# Patient Record
Sex: Male | Born: 1965 | Race: Black or African American | Hispanic: No | Smoking: Never smoker
Health system: Southern US, Community
[De-identification: ages and names within clinical notes are randomized; demographics above are authoritative.]

## PROBLEM LIST (undated history)

## (undated) ENCOUNTER — Emergency Department (HOSPITAL_COMMUNITY): Discharge status: Absent without leave | Payer: 59

## (undated) DIAGNOSIS — K219 Gastro-esophageal reflux disease without esophagitis: Secondary | ICD-10-CM

## (undated) DIAGNOSIS — M549 Dorsalgia, unspecified: Secondary | ICD-10-CM

## (undated) DIAGNOSIS — N400 Enlarged prostate without lower urinary tract symptoms: Secondary | ICD-10-CM

## (undated) DIAGNOSIS — B029 Zoster without complications: Secondary | ICD-10-CM

---

## 2001-01-13 ENCOUNTER — Emergency Department (HOSPITAL_COMMUNITY): Admission: EM | Admit: 2001-01-13 | Discharge: 2001-01-14 | Payer: Self-pay | Admitting: *Deleted

## 2001-01-13 ENCOUNTER — Encounter: Payer: Self-pay | Admitting: *Deleted

## 2001-01-14 ENCOUNTER — Encounter: Payer: Self-pay | Admitting: Emergency Medicine

## 2001-01-14 ENCOUNTER — Inpatient Hospital Stay (HOSPITAL_COMMUNITY): Admission: EM | Admit: 2001-01-14 | Discharge: 2001-01-15 | Payer: Self-pay | Admitting: Emergency Medicine

## 2001-01-14 ENCOUNTER — Encounter: Payer: Self-pay | Admitting: *Deleted

## 2001-01-15 ENCOUNTER — Encounter: Payer: Self-pay | Admitting: Internal Medicine

## 2001-11-03 ENCOUNTER — Emergency Department (HOSPITAL_COMMUNITY): Admission: EM | Admit: 2001-11-03 | Discharge: 2001-11-03 | Payer: Self-pay

## 2002-07-26 ENCOUNTER — Encounter: Payer: Self-pay | Admitting: Emergency Medicine

## 2002-07-26 ENCOUNTER — Emergency Department (HOSPITAL_COMMUNITY): Admission: EM | Admit: 2002-07-26 | Discharge: 2002-07-26 | Payer: Self-pay | Admitting: Emergency Medicine

## 2003-05-31 ENCOUNTER — Emergency Department (HOSPITAL_COMMUNITY): Admission: EM | Admit: 2003-05-31 | Discharge: 2003-05-31 | Payer: Self-pay | Admitting: Emergency Medicine

## 2003-07-05 ENCOUNTER — Emergency Department (HOSPITAL_COMMUNITY): Admission: EM | Admit: 2003-07-05 | Discharge: 2003-07-05 | Payer: Self-pay | Admitting: Emergency Medicine

## 2003-07-08 ENCOUNTER — Emergency Department (HOSPITAL_COMMUNITY): Admission: EM | Admit: 2003-07-08 | Discharge: 2003-07-08 | Payer: Self-pay | Admitting: *Deleted

## 2003-07-11 ENCOUNTER — Ambulatory Visit (HOSPITAL_COMMUNITY): Admission: RE | Admit: 2003-07-11 | Discharge: 2003-07-11 | Payer: Self-pay | Admitting: *Deleted

## 2004-10-27 ENCOUNTER — Emergency Department (HOSPITAL_COMMUNITY): Admission: EM | Admit: 2004-10-27 | Discharge: 2004-10-27 | Payer: Self-pay | Admitting: Emergency Medicine

## 2004-11-12 ENCOUNTER — Emergency Department (HOSPITAL_COMMUNITY): Admission: EM | Admit: 2004-11-12 | Discharge: 2004-11-12 | Payer: Self-pay | Admitting: Emergency Medicine

## 2004-12-22 ENCOUNTER — Emergency Department (HOSPITAL_COMMUNITY): Admission: EM | Admit: 2004-12-22 | Discharge: 2004-12-22 | Payer: Self-pay | Admitting: Emergency Medicine

## 2005-05-19 ENCOUNTER — Emergency Department (HOSPITAL_COMMUNITY): Admission: EM | Admit: 2005-05-19 | Discharge: 2005-05-19 | Payer: Self-pay | Admitting: Emergency Medicine

## 2006-01-19 ENCOUNTER — Ambulatory Visit (HOSPITAL_COMMUNITY): Admission: RE | Admit: 2006-01-19 | Discharge: 2006-01-19 | Payer: Self-pay | Admitting: Family Medicine

## 2007-01-10 ENCOUNTER — Emergency Department (HOSPITAL_COMMUNITY): Admission: EM | Admit: 2007-01-10 | Discharge: 2007-01-10 | Payer: Self-pay | Admitting: Emergency Medicine

## 2008-01-05 ENCOUNTER — Emergency Department (HOSPITAL_COMMUNITY): Admission: EM | Admit: 2008-01-05 | Discharge: 2008-01-05 | Payer: Self-pay | Admitting: Emergency Medicine

## 2008-08-18 ENCOUNTER — Emergency Department (HOSPITAL_COMMUNITY): Admission: EM | Admit: 2008-08-18 | Discharge: 2008-08-18 | Payer: Self-pay | Admitting: Emergency Medicine

## 2009-01-14 ENCOUNTER — Emergency Department (HOSPITAL_COMMUNITY): Admission: EM | Admit: 2009-01-14 | Discharge: 2009-01-14 | Payer: Self-pay | Admitting: Emergency Medicine

## 2009-01-17 ENCOUNTER — Emergency Department (HOSPITAL_COMMUNITY): Admission: EM | Admit: 2009-01-17 | Discharge: 2009-01-17 | Payer: Self-pay | Admitting: Emergency Medicine

## 2009-05-24 DIAGNOSIS — B029 Zoster without complications: Secondary | ICD-10-CM

## 2009-05-24 HISTORY — DX: Zoster without complications: B02.9

## 2010-01-29 ENCOUNTER — Emergency Department (HOSPITAL_COMMUNITY): Admission: EM | Admit: 2010-01-29 | Discharge: 2010-01-29 | Payer: Self-pay | Admitting: Emergency Medicine

## 2010-04-16 ENCOUNTER — Emergency Department (HOSPITAL_COMMUNITY)
Admission: EM | Admit: 2010-04-16 | Discharge: 2010-04-16 | Payer: Self-pay | Source: Home / Self Care | Admitting: Emergency Medicine

## 2010-06-14 ENCOUNTER — Encounter: Payer: Self-pay | Admitting: Neurosurgery

## 2010-06-15 ENCOUNTER — Encounter: Payer: Self-pay | Admitting: Family Medicine

## 2010-07-07 ENCOUNTER — Emergency Department (HOSPITAL_COMMUNITY): Payer: Self-pay

## 2010-07-07 ENCOUNTER — Emergency Department (HOSPITAL_COMMUNITY)
Admission: EM | Admit: 2010-07-07 | Discharge: 2010-07-07 | Disposition: A | Discharge status: Home / Self Care | Payer: Self-pay | Attending: Emergency Medicine | Admitting: Emergency Medicine

## 2010-07-07 ENCOUNTER — Encounter (HOSPITAL_COMMUNITY): Payer: Self-pay | Admitting: Radiology

## 2010-07-07 DIAGNOSIS — J019 Acute sinusitis, unspecified: Secondary | ICD-10-CM | POA: Insufficient documentation

## 2010-07-07 DIAGNOSIS — H53149 Visual discomfort, unspecified: Secondary | ICD-10-CM | POA: Insufficient documentation

## 2010-07-07 DIAGNOSIS — R51 Headache: Secondary | ICD-10-CM | POA: Insufficient documentation

## 2010-07-07 LAB — CBC
HCT: 46.6 % (ref 39.0–52.0)
Hemoglobin: 16 g/dL (ref 13.0–17.0)
MCH: 31.9 pg (ref 26.0–34.0)
MCV: 92.8 fL (ref 78.0–100.0)

## 2010-07-07 LAB — DIFFERENTIAL
Basophils Absolute: 0 10*3/uL (ref 0.0–0.1)
Basophils Relative: 0 % (ref 0–1)
Lymphocytes Relative: 12 % (ref 12–46)
Monocytes Relative: 8 % (ref 3–12)

## 2010-07-07 LAB — BASIC METABOLIC PANEL
CO2: 22 mEq/L (ref 19–32)
Calcium: 9.5 mg/dL (ref 8.4–10.5)
Chloride: 106 mEq/L (ref 96–112)
Creatinine, Ser: 1.07 mg/dL (ref 0.4–1.5)
GFR calc Af Amer: >60 mL/min (ref >60)
GFR calc non Af Amer: >60 mL/min (ref >60)
Glucose, Bld: 92 mg/dL (ref 70–99)
Sodium: 138 mEq/L (ref 135–145)

## 2010-08-04 ENCOUNTER — Emergency Department (HOSPITAL_COMMUNITY)
Admission: EM | Admit: 2010-08-04 | Discharge: 2010-08-04 | Disposition: A | Discharge status: Home / Self Care | Payer: Self-pay | Attending: Emergency Medicine | Admitting: Emergency Medicine

## 2010-08-04 DIAGNOSIS — B029 Zoster without complications: Secondary | ICD-10-CM | POA: Insufficient documentation

## 2010-08-04 DIAGNOSIS — M549 Dorsalgia, unspecified: Secondary | ICD-10-CM | POA: Insufficient documentation

## 2010-09-03 LAB — URINALYSIS, ROUTINE W REFLEX MICROSCOPIC
Hgb urine dipstick: NEGATIVE
Urobilinogen, UA: 1 mg/dL (ref 0.0–1.0)

## 2010-09-03 LAB — BASIC METABOLIC PANEL
BUN: 9 mg/dL (ref 6–23)
CO2: 26 mEq/L (ref 19–32)
Chloride: 106 mEq/L (ref 96–112)
Creatinine, Ser: 1.08 mg/dL (ref 0.4–1.5)
GFR calc non Af Amer: >60 mL/min (ref >60)
Potassium: 3.8 mEq/L (ref 3.5–5.1)
Sodium: 138 mEq/L (ref 135–145)

## 2010-09-03 LAB — CBC
Platelets: 275 10*3/uL (ref 150–400)
RBC: 4.97 MIL/uL (ref 4.22–5.81)
RDW: 13.2 % (ref 11.5–15.5)

## 2010-09-03 LAB — DIFFERENTIAL: Monocytes Absolute: 0.8 10*3/uL (ref 0.1–1.0)

## 2011-06-14 ENCOUNTER — Encounter (HOSPITAL_COMMUNITY): Payer: Self-pay

## 2011-06-14 ENCOUNTER — Emergency Department (HOSPITAL_COMMUNITY): Payer: Self-pay

## 2011-06-14 ENCOUNTER — Emergency Department (HOSPITAL_COMMUNITY)
Admission: EM | Admit: 2011-06-14 | Discharge: 2011-06-14 | Disposition: A | Discharge status: Home / Self Care | Payer: Self-pay | Attending: Emergency Medicine | Admitting: Emergency Medicine

## 2011-06-14 DIAGNOSIS — R109 Unspecified abdominal pain: Secondary | ICD-10-CM | POA: Insufficient documentation

## 2011-06-14 DIAGNOSIS — M545 Low back pain, unspecified: Secondary | ICD-10-CM | POA: Insufficient documentation

## 2011-06-14 DIAGNOSIS — R059 Cough, unspecified: Secondary | ICD-10-CM | POA: Insufficient documentation

## 2011-06-14 DIAGNOSIS — B349 Viral infection, unspecified: Secondary | ICD-10-CM

## 2011-06-14 DIAGNOSIS — B9789 Other viral agents as the cause of diseases classified elsewhere: Secondary | ICD-10-CM | POA: Insufficient documentation

## 2011-06-14 DIAGNOSIS — R05 Cough: Secondary | ICD-10-CM | POA: Insufficient documentation

## 2011-06-14 HISTORY — DX: Dorsalgia, unspecified: M54.9

## 2011-06-14 LAB — URINALYSIS, ROUTINE W REFLEX MICROSCOPIC
Bilirubin Urine: NEGATIVE
Protein, ur: NEGATIVE mg/dL
Specific Gravity, Urine: 1.025 (ref 1.005–1.030)
Urobilinogen, UA: 0.2 mg/dL (ref 0.0–1.0)

## 2011-06-14 LAB — COMPREHENSIVE METABOLIC PANEL
ALT: 31 U/L (ref 0–53)
Albumin: 3.8 g/dL (ref 3.5–5.2)
Alkaline Phosphatase: 73 U/L (ref 39–117)
BUN: 12 mg/dL (ref 6–23)
CO2: 25 mEq/L (ref 19–32)
Creatinine, Ser: 1.13 mg/dL (ref 0.50–1.35)
GFR calc Af Amer: 90 mL/min — ABNORMAL LOW (ref >90)
GFR calc non Af Amer: 77 mL/min — ABNORMAL LOW (ref >90)
Potassium: 4.1 mEq/L (ref 3.5–5.1)
Total Protein: 7.2 g/dL (ref 6.0–8.3)

## 2011-06-14 MED ORDER — TRAMADOL HCL 50 MG PO TABS: 50.0000 mg | ORAL_TABLET | Freq: Three times a day (TID) | ORAL | Status: AC | PRN

## 2011-06-14 MED ORDER — SODIUM CHLORIDE 0.9 % IV BOLUS (SEPSIS)
1000.0000 mL | Freq: Once | INTRAVENOUS | Status: AC
  Administered 2011-06-14: 1000 mL via INTRAVENOUS

## 2011-06-14 MED ORDER — KETOROLAC TROMETHAMINE 30 MG/ML IJ SOLN
30.0000 mg | Freq: Once | INTRAMUSCULAR | Status: AC
  Administered 2011-06-14: 30 mg via INTRAVENOUS
  Filled 2011-06-14: qty 1

## 2011-06-14 NOTE — ED Notes (Signed)
Pt denies abd pain at this time, however, is still c/o lower back pain rating it at a 5.

## 2011-06-14 NOTE — ED Provider Notes (Signed)
This chart was scribed for Gerhard Munch, MD by Williemae Natter. The patient was seen in room APA19/APA19 at 7:44 AM.  CSN: [id number]  Arrival date & time 06/14/11  1478   First MD Initiated Contact with Patient 06/14/11 [phone]      Chief Complaint  Patient presents with  . URI  . Back Pain  . Abdominal Pain    (Consider location/radiation/quality/duration/timing/severity/associated sxs/prior treatment) HPI Cory Terry is a 46 y.o. male who presents to the Emergency Department complaining of abdominal pain and lower back pain since this morning when he woke up for work. Pt has had cold symptoms for 3-4 days which he has been treating with alka-seltzer and cough drops with mild improvement. Pt denies any fever, nausea, vomiting, or diarrhea. He has not had a flu shot and does have sick contacts both at home and at work.   Past Medical History  Diagnosis Date  . Back pain     History reviewed. No pertinent past surgical history.  No family history on file.  History  Substance Use Topics  . Smoking status: Never Smoker   . Smokeless tobacco: Not on file  . Alcohol Use: Yes     occasionally      Review of Systems  Constitutional: Negative for fever and chills.  Eyes: Negative for photophobia.  Respiratory: Positive for cough. Negative for apnea.   Gastrointestinal: Positive for abdominal pain. Negative for nausea, vomiting and diarrhea.  Musculoskeletal: Positive for back pain.  Neurological: Negative for light-headedness and headaches.  Psychiatric/Behavioral: Negative for confusion.    Allergies  Review of patient's allergies indicates no known allergies.  Home Medications  No current outpatient prescriptions on file. Pulse oximetry on room air is 100%. Normal by my interpretation.  BP 138/86  Pulse 62  Temp(Src) 98 F (36.7 C) (Oral)  Resp 18  Ht 6\' 2"  (1.88 m)  Wt 194 lb (87.998 kg)  BMI 24.91 kg/m2  SpO2 100%  Physical Exam  Nursing note and  vitals reviewed. Constitutional: He is oriented to person, place, and time. He appears well-developed and well-nourished.  HENT:  Head: Normocephalic and atraumatic.  Neck: Normal range of motion. Neck supple.  Cardiovascular: Normal rate, regular rhythm and normal heart sounds.   Pulmonary/Chest: Effort normal and breath sounds normal.  Abdominal: Soft.  Musculoskeletal:       Negative straight leg  Neurological: He is alert and oriented to person, place, and time.  Skin: Skin is warm and dry.  Psychiatric: He has a normal mood and affect. His behavior is normal.    ED Course  Procedures (including critical care time)   Labs Reviewed  URINALYSIS, ROUTINE W REFLEX MICROSCOPIC   No results found.   No diagnosis found.  CXR (reviewed by me) - no acute findings  MDM  I personally performed the services described in this documentation, which was scribed in my presence. The recorded information has been reviewed and considered.  Generally well 46 year old male with one week of URI like symptoms.  Now presents with new abdominal/back pain.  On exam the patient is in no distress with unremarkable vital signs, and no appreciable significant, abdominal tenderness, nor any focal neurologic deficits.  The patient's x-ray does not suggest pneumonia, nor acute pathology.  The patient's lab findings are unremarkable, given the patient's description of one week of illness, his exposure to multiple sick contacts, this illness is most consistent with viral etiology.  The absence of acute findings, or laboratory results  is reassuring.  The patient will be discharged with instructions for further symptom control and PMD followup   Gerhard Munch, MD 06/14/11 [phone]

## 2011-06-14 NOTE — ED Notes (Signed)
Pt reports cold symptoms x 1 week.  Reports woke up early this am with abd pain and lower back pain.  Denies any n/v/d.  Denies any urinary problems.

## 2012-05-17 ENCOUNTER — Emergency Department (HOSPITAL_COMMUNITY)
Admission: EM | Admit: 2012-05-17 | Discharge: 2012-05-17 | Disposition: A | Discharge status: Home / Self Care | Payer: Self-pay | Attending: Emergency Medicine | Admitting: Emergency Medicine

## 2012-05-17 ENCOUNTER — Encounter (HOSPITAL_COMMUNITY): Payer: Self-pay | Admitting: *Deleted

## 2012-05-17 DIAGNOSIS — R220 Localized swelling, mass and lump, head: Secondary | ICD-10-CM | POA: Insufficient documentation

## 2012-05-17 DIAGNOSIS — R1110 Vomiting, unspecified: Secondary | ICD-10-CM | POA: Insufficient documentation

## 2012-05-17 DIAGNOSIS — K0889 Other specified disorders of teeth and supporting structures: Secondary | ICD-10-CM

## 2012-05-17 DIAGNOSIS — K089 Disorder of teeth and supporting structures, unspecified: Secondary | ICD-10-CM | POA: Insufficient documentation

## 2012-05-17 DIAGNOSIS — H9209 Otalgia, unspecified ear: Secondary | ICD-10-CM | POA: Insufficient documentation

## 2012-05-17 DIAGNOSIS — Z8739 Personal history of other diseases of the musculoskeletal system and connective tissue: Secondary | ICD-10-CM | POA: Insufficient documentation

## 2012-05-17 LAB — BASIC METABOLIC PANEL
BUN: 17 mg/dL (ref 6–23)
GFR calc Af Amer: 80 mL/min — ABNORMAL LOW (ref >90)
GFR calc non Af Amer: 69 mL/min — ABNORMAL LOW (ref >90)
Glucose, Bld: 91 mg/dL (ref 70–99)
Potassium: 3.2 mEq/L — ABNORMAL LOW (ref 3.5–5.1)

## 2012-05-17 LAB — CBC WITH DIFFERENTIAL/PLATELET
Basophils Absolute: 0 10*3/uL (ref 0.0–0.1)
HCT: 45.6 % (ref 39.0–52.0)
Hemoglobin: 15.6 g/dL (ref 13.0–17.0)
Lymphocytes Relative: 21 % (ref 12–46)
MCH: 31.6 pg (ref 26.0–34.0)
MCHC: 34.2 g/dL (ref 30.0–36.0)
Monocytes Absolute: 1 10*3/uL (ref 0.1–1.0)
Monocytes Relative: 9 % (ref 3–12)
Neutrophils Relative %: 69 % (ref 43–77)
RBC: 4.94 MIL/uL (ref 4.22–5.81)

## 2012-05-17 MED ORDER — FAMOTIDINE IN NACL 20-0.9 MG/50ML-% IV SOLN
20.0000 mg | Freq: Once | INTRAVENOUS | Status: AC
  Administered 2012-05-17: 20 mg via INTRAVENOUS
  Filled 2012-05-17: qty 50

## 2012-05-17 MED ORDER — ONDANSETRON HCL 4 MG/2ML IJ SOLN
4.0000 mg | Freq: Once | INTRAMUSCULAR | Status: AC
  Administered 2012-05-17: 4 mg via INTRAVENOUS
  Filled 2012-05-17: qty 2

## 2012-05-17 MED ORDER — HYDROMORPHONE HCL PF 1 MG/ML IJ SOLN
1.0000 mg | Freq: Once | INTRAMUSCULAR | Status: AC
  Administered 2012-05-17: 1 mg via INTRAVENOUS
  Filled 2012-05-17: qty 1

## 2012-05-17 MED ORDER — SODIUM CHLORIDE 0.9 % IV SOLN
Freq: Once | INTRAVENOUS | Status: AC
  Administered 2012-05-17: 19:00:00 via INTRAVENOUS

## 2012-05-17 MED ORDER — OXYCODONE-ACETAMINOPHEN 5-325 MG PO TABS: 1.0000 | ORAL_TABLET | ORAL | Status: AC | PRN

## 2012-05-17 MED ORDER — AMOXICILLIN 500 MG PO CAPS: 500.0000 mg | ORAL_CAPSULE | Freq: Three times a day (TID) | ORAL | Status: DC

## 2012-05-17 MED ORDER — CLINDAMYCIN HCL 150 MG PO CAPS
300.0000 mg | ORAL_CAPSULE | Freq: Once | ORAL | Status: AC
  Administered 2012-05-17: 300 mg via ORAL
  Filled 2012-05-17: qty 2

## 2012-05-17 MED ORDER — OXYCODONE-ACETAMINOPHEN 5-325 MG PO TABS
1.0000 | ORAL_TABLET | Freq: Once | ORAL | Status: AC
  Administered 2012-05-17: 1 via ORAL
  Filled 2012-05-17: qty 1

## 2012-05-17 NOTE — ED Notes (Signed)
Pt c/o right side dental pain that radiates to jaw. Pt also c/o vomiting which he relates to the pain.

## 2012-05-17 NOTE — ED Notes (Signed)
Pt states he thinks a tooth on right upper jaw may have broke off, c/o pain x 2 days, states that he taking a lot of ibuprofen and it's making him sick with vomiting

## 2012-05-17 NOTE — ED Provider Notes (Signed)
History     CSN: [id number]  Arrival date & time 05/17/12  1755   First MD Initiated Contact with Patient 05/17/12 1811      Chief Complaint  Patient presents with  . Dental Pain  . Emesis    (Consider location/radiation/quality/duration/timing/severity/associated sxs/prior treatment) HPI Comments: Patient c/o right upper tooth pain for 2 days.  States that a tooth recently "broke off" and since then he has been experiencing sharp pains in his tooth and upper jaw that radiate to his right ear and face.  States the pain is worse when he tries to eat anything hot or cold.  He also c/o vomiting today.  States that he has been taking ibuprofen every 4-6 hrs today and he thinks that it may have upset his stomach.  He denies hx of stomach ulcers, fever, abdominal pain, difficulty swallowing or breathing.    Patient is a 46 y.o. male presenting with tooth pain. The history is provided by the patient.  Dental PainThe primary symptoms include mouth pain. Primary symptoms do not include dental injury, oral bleeding, oral lesions, headaches, fever, shortness of breath, sore throat, angioedema or cough. The symptoms began 2 days ago. The symptoms are worsening. The symptoms are new. The symptoms occur constantly.  Mouth pain began 24 -48 hours ago. Mouth pain occurs constantly. Mouth pain is unchanged. Affected locations include: teeth and gum(s).  Additional symptoms include: dental sensitivity to temperature, gum tenderness, facial swelling and ear pain. Additional symptoms do not include: gum swelling, purulent gums, trismus, jaw pain, trouble swallowing, pain with swallowing, drooling, nosebleeds and swollen glands. Associated symptoms comments: Intermittent vomiting for 24 hrs. Medical issues include: periodontal disease. Medical issues do not include: smoking.    Past Medical History  Diagnosis Date  . Back pain     History reviewed. No pertinent past surgical history.  History reviewed. No  pertinent family history.  History  Substance Use Topics  . Smoking status: Never Smoker   . Smokeless tobacco: Not on file  . Alcohol Use: Yes     Comment: occasionally      Review of Systems  Constitutional: Negative for fever, chills, activity change and appetite change.  HENT: Positive for ear pain, facial swelling and dental problem. Negative for nosebleeds, sore throat, drooling, trouble swallowing, neck pain and neck stiffness.   Eyes: Negative for visual disturbance.  Respiratory: Negative for cough and shortness of breath.   Cardiovascular: Negative for chest pain.  Gastrointestinal: Positive for vomiting. Negative for abdominal pain and abdominal distention.  Genitourinary: Negative for difficulty urinating.  Musculoskeletal: Negative for arthralgias.  Skin: Negative for color change.  Neurological: Negative for dizziness, speech difficulty, weakness, numbness and headaches.  All other systems reviewed and are negative.    Allergies  Review of patient's allergies indicates no known allergies.  Home Medications   Current Outpatient Rx  Name  Route  Sig  Dispense  Refill  . OXYCODONE HCL ER 15 MG PO TB12   Oral   Take 15 mg by mouth every 12 (twelve) hours as needed. For back pain           BP 148/92  Pulse 72  Temp 97.9 F (36.6 C) (Oral)  Resp 20  Ht 6\' 2"  (1.88 m)  Wt 176 lb (79.833 kg)  BMI 22.60 kg/m2  SpO2 97%  Physical Exam  Nursing note and vitals reviewed. Constitutional: He is oriented to person, place, and time. He appears well-developed and well-nourished. No distress.  HENT:  Head: Normocephalic and atraumatic. No trismus in the jaw.  Right Ear: Tympanic membrane and ear canal normal.  Left Ear: Tympanic membrane and ear canal normal.  Mouth/Throat: Uvula is midline, oropharynx is clear and moist and mucous membranes are normal. Dental caries present. No dental abscesses or uvula swelling.       Widespread periodontal disease.  No  fluctuance of the gums.  No trismus  Neck: Normal range of motion. Neck supple.  Cardiovascular: Normal rate, regular rhythm, normal heart sounds and intact distal pulses.   No murmur heard. Pulmonary/Chest: Effort normal and breath sounds normal.  Abdominal: Soft. He exhibits no distension and no mass. There is no tenderness. There is no rebound and no guarding.  Musculoskeletal: Normal range of motion.  Lymphadenopathy:    He has no cervical adenopathy.  Neurological: He is alert and oriented to person, place, and time. He exhibits normal muscle tone. Coordination normal.  Skin: Skin is warm and dry.    ED Course  Procedures (including critical care time)  Labs Reviewed  CBC WITH DIFFERENTIAL - Abnormal; Notable for the following:    WBC 11.4 (*)     Neutro Abs 7.8 (*)     All other components within normal limits  BASIC METABOLIC PANEL - Abnormal; Notable for the following:    Potassium 3.2 (*)     GFR calc non Af Amer 69 (*)     GFR calc Af Amer 80 (*)     All other components within normal limits        MDM   Patient has multiple dental caries.  No obvious dental abscess, trismus or airway compromise.  Mild sts of the right face.  Possible early abscess.  Will order IVF's, check labs and address his pain.    Patient is feeling better, has tolerated po fluid trial,  Abdomen remains soft and NT.  Labs reviewed.  Hypokalemia likely from vomiting, pt does not take diuretics. Pt appears stable for d/c.   Pt agrees to close f/u with his dentist.  Referral list also given.  Advised to refrain from excessive NSAID use.  Prescribed:  Percocet #15 amoxil    Delani Kohli L. [employer], [place] 05/19/12 [id number]

## 2012-05-19 NOTE — ED Provider Notes (Signed)
Medical screening examination/treatment/procedure(s) were performed by non-physician practitioner and as supervising physician I was immediately available for consultation/collaboration.  Azekiel Cremer, MD 05/19/12 2354 

## 2012-11-09 ENCOUNTER — Other Ambulatory Visit (HOSPITAL_COMMUNITY): Payer: Self-pay | Admitting: Family Medicine

## 2012-11-09 ENCOUNTER — Ambulatory Visit (HOSPITAL_COMMUNITY)
Admission: RE | Admit: 2012-11-09 | Discharge: 2012-11-09 | Disposition: A | Discharge status: Home / Self Care | Payer: Self-pay | Source: Ambulatory Visit | Attending: Family Medicine | Admitting: Family Medicine

## 2012-11-09 DIAGNOSIS — M545 Low back pain, unspecified: Secondary | ICD-10-CM | POA: Insufficient documentation

## 2012-11-09 DIAGNOSIS — M5137 Other intervertebral disc degeneration, lumbosacral region: Secondary | ICD-10-CM | POA: Insufficient documentation

## 2012-11-09 DIAGNOSIS — M51379 Other intervertebral disc degeneration, lumbosacral region without mention of lumbar back pain or lower extremity pain: Secondary | ICD-10-CM | POA: Insufficient documentation

## 2013-01-11 ENCOUNTER — Encounter (HOSPITAL_COMMUNITY): Payer: Self-pay | Admitting: *Deleted

## 2013-01-11 ENCOUNTER — Emergency Department (HOSPITAL_COMMUNITY)
Admission: EM | Admit: 2013-01-11 | Discharge: 2013-01-11 | Disposition: A | Discharge status: Home / Self Care | Payer: Self-pay | Attending: Emergency Medicine | Admitting: Emergency Medicine

## 2013-01-11 DIAGNOSIS — T6391XA Toxic effect of contact with unspecified venomous animal, accidental (unintentional), initial encounter: Secondary | ICD-10-CM | POA: Insufficient documentation

## 2013-01-11 DIAGNOSIS — Y9389 Activity, other specified: Secondary | ICD-10-CM | POA: Insufficient documentation

## 2013-01-11 DIAGNOSIS — R220 Localized swelling, mass and lump, head: Secondary | ICD-10-CM | POA: Insufficient documentation

## 2013-01-11 DIAGNOSIS — M549 Dorsalgia, unspecified: Secondary | ICD-10-CM | POA: Insufficient documentation

## 2013-01-11 DIAGNOSIS — Y9289 Other specified places as the place of occurrence of the external cause: Secondary | ICD-10-CM | POA: Insufficient documentation

## 2013-01-11 DIAGNOSIS — T63461A Toxic effect of venom of wasps, accidental (unintentional), initial encounter: Secondary | ICD-10-CM | POA: Insufficient documentation

## 2013-01-11 MED ORDER — EPINEPHRINE HCL 0.1 MG/ML IJ SOSY
0.3000 mg | PREFILLED_SYRINGE | Freq: Once | INTRAMUSCULAR | Status: DC
  Filled 2013-01-11: qty 10

## 2013-01-11 MED ORDER — DIPHENHYDRAMINE HCL 25 MG PO CAPS
25.0000 mg | ORAL_CAPSULE | Freq: Once | ORAL | Status: AC
  Administered 2013-01-11: 25 mg via ORAL
  Filled 2013-01-11: qty 1

## 2013-01-11 MED ORDER — PREDNISONE 10 MG PO TABS: 20.0000 mg | ORAL_TABLET | Freq: Every day | ORAL | Status: DC

## 2013-01-11 MED ORDER — EPINEPHRINE HCL 1 MG/ML IJ SOLN
0.3000 mg | Freq: Once | INTRAMUSCULAR | Status: AC
Start: 2013-01-11 — End: 2013-01-11
  Administered 2013-01-11: 0.3 mg via SUBCUTANEOUS
  Filled 2013-01-11: qty 1

## 2013-01-11 MED ORDER — EPINEPHRINE 0.3 MG/0.3ML IJ SOAJ: 0.3000 mg | INTRAMUSCULAR | Status: DC | PRN

## 2013-01-11 MED ORDER — PREDNISONE 10 MG PO TABS
60.0000 mg | ORAL_TABLET | Freq: Once | ORAL | Status: AC
  Administered 2013-01-11: 60 mg via ORAL
  Filled 2013-01-11: qty 1

## 2013-01-11 MED ORDER — DIPHENHYDRAMINE HCL 25 MG PO CAPS: 25.0000 mg | ORAL_CAPSULE | Freq: Four times a day (QID) | ORAL | Status: DC | PRN

## 2013-01-11 NOTE — ED Provider Notes (Signed)
CSN: [id number]     Arrival date & time 01/11/13  2008 History  This chart was scribed for Claudean Kinds, MD by Dorothey Baseman, ED Scribe and Bennett Scrape, ED Scribe. This patient was seen in room APA14/APA14 and the patient's care was started at 8:37 PM.   First MD Initiated Contact with Patient 01/11/13 2037     Chief Complaint  Patient presents with  . Insect Bite    The history is provided by the patient. No language interpreter was used.   HPI Comments: Cory Terry is a 47 y.o. male who presents to the Emergency Department complaining of insect bite that occurred around 5:30PM today. Patient was working at his job when he was stung by a yellow jacket in his right cheek. He reports some associated gradual facial swelling, but no difficulty breathing. He denies any prior episodes related to insect bites. Patient denies nausea, emesis, itching, urticaria, or rash. He denies any allergies to medications. He reports no cardiac or hypertension conditions.  He reports chronic back pain that is treated with oxycodone.   Past Medical History  Diagnosis Date  . Back pain    History reviewed. No pertinent past surgical history. History reviewed. No pertinent family history. History  Substance Use Topics  . Smoking status: Never Smoker   . Smokeless tobacco: Not on file  . Alcohol Use: Yes     Comment: occasionally    Review of Systems  Constitutional: Negative for fever, chills, diaphoresis, appetite change and fatigue.  HENT: Positive for facial swelling. Negative for sore throat, mouth sores and trouble swallowing.   Eyes: Negative for visual disturbance.  Respiratory: Negative for cough, chest tightness, shortness of breath and wheezing.   Cardiovascular: Negative for chest pain.  Gastrointestinal: Negative for nausea, vomiting, abdominal pain, diarrhea and abdominal distention.  Endocrine: Negative for polydipsia, polyphagia and polyuria.  Genitourinary: Negative for  dysuria, frequency and hematuria.  Musculoskeletal: Positive for back pain. Negative for gait problem.  Skin: Negative for color change, pallor and rash.  Neurological: Negative for dizziness, syncope, light-headedness and headaches.  Hematological: Does not bruise/bleed easily.  Psychiatric/Behavioral: Negative for behavioral problems and confusion.    Allergies  Review of patient's allergies indicates no known allergies.  Home Medications   Current Outpatient Rx  Name  Route  Sig  Dispense  Refill  . oxyCODONE (OXYCONTIN) 15 MG TB12   Oral   Take 15 mg by mouth every 12 (twelve) hours as needed. For back pain         . diphenhydrAMINE (BENADRYL) 25 mg capsule   Oral   Take 1 capsule (25 mg total) by mouth every 6 (six) hours as needed for itching.   8 capsule   0   . EPINEPHrine (EPIPEN) 0.3 mg/0.3 mL SOAJ injection   Intramuscular   Inject 0.3 mLs (0.3 mg total) into the muscle as needed.   1 Device   0   . predniSONE (DELTASONE) 10 MG tablet   Oral   Take 2 tablets (20 mg total) by mouth daily.   4 tablet   0    Triage Vitals: BP 114/78  Pulse 87  Temp(Src) 98 F (36.7 C) (Oral)  Resp 20  Ht 6\' 2"  (1.88 m)  Wt 186 lb (84.369 kg)  BMI 23.87 kg/m2  SpO2 98%  Physical Exam  Nursing note and vitals reviewed. Constitutional: He is oriented to person, place, and time. He appears well-developed and well-nourished. No distress.  HENT:  Mouth/Throat: Oropharynx is clear and moist.  No pharyngeal swelling. Angioedema on the right lower lip and right cheek. Handling secretions. Normal phonation.   Eyes: Conjunctivae are normal. Pupils are equal, round, and reactive to light. No scleral icterus.  Neck: Normal range of motion. Neck supple. No thyromegaly present.  Cardiovascular: Normal rate and regular rhythm.  Exam reveals no gallop and no friction rub.   No murmur heard. Pulmonary/Chest: Effort normal and breath sounds normal. No respiratory distress. He has no  wheezes. He has no rales.  Abdominal: Soft. Bowel sounds are normal. He exhibits no distension. There is no tenderness. There is no rebound.  Musculoskeletal: Normal range of motion.  No rash on back.   Neurological: He is alert and oriented to person, place, and time.  Skin: Skin is warm and dry. No rash noted.  Psychiatric: He has a normal mood and affect. His behavior is normal.    ED Course  DIAGNOSTIC STUDIES: Oxygen Saturation is 98% on room air, normal by my interpretation.    COORDINATION OF CARE: 8:40PM- Ordered benadryl and steroids to control swelling. Will order a shot of epinephrine and discussed future epi-pen use. Discussed treatment plan with patient at bedside and patient agreed to plan.    Procedures (including critical care time)  Labs Reviewed - No data to display No results found. 1. Hymenoptera reaction, initial encounter     MDM  He is observed for over 2 hours. I reexamined him x2. The swelling of the right face has decreased. He is sleeping comfortably. He can lay supine. No dyspnea. Repeat exam is normal the posterior pharynx. Markedly improved edema to the right cheek.  I discussed this followup and outpatient treatment with him. He'll be on Benadryl and prednisone for the next 48 hours. Given a prescription for an epinephrine pen and instructed in its use.  I personally performed the services described in this documentation, which was scribed in my presence. The recorded information has been reviewed and is accurate.    Claudean Kinds, MD 01/11/13 2230

## 2013-01-11 NOTE — ED Notes (Signed)
Pt sleeping, easily awakens, pt reports feeling some better.

## 2013-01-11 NOTE — ED Notes (Signed)
Pt alert & oriented x4, stable gait. Patient given discharge instructions, paperwork & prescription(s). Patient  instructed to stop at the registration desk to finish any additional paperwork. Patient verbalized understanding. Pt left department w/ no further questions. 
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
## 2013-09-25 ENCOUNTER — Emergency Department (HOSPITAL_COMMUNITY)
Admission: EM | Admit: 2013-09-25 | Discharge: 2013-09-25 | Disposition: A | Discharge status: Home / Self Care | Payer: No Typology Code available for payment source | Attending: Emergency Medicine | Admitting: Emergency Medicine

## 2013-09-25 ENCOUNTER — Encounter (HOSPITAL_COMMUNITY): Payer: Self-pay | Admitting: Emergency Medicine

## 2013-09-25 DIAGNOSIS — IMO0002 Reserved for concepts with insufficient information to code with codable children: Secondary | ICD-10-CM | POA: Diagnosis present

## 2013-09-25 DIAGNOSIS — Y92410 Unspecified street and highway as the place of occurrence of the external cause: Secondary | ICD-10-CM | POA: Diagnosis not present

## 2013-09-25 DIAGNOSIS — Y9389 Activity, other specified: Secondary | ICD-10-CM | POA: Diagnosis not present

## 2013-09-25 DIAGNOSIS — G8929 Other chronic pain: Secondary | ICD-10-CM | POA: Insufficient documentation

## 2013-09-25 DIAGNOSIS — M549 Dorsalgia, unspecified: Secondary | ICD-10-CM

## 2013-09-25 MED ORDER — IBUPROFEN 800 MG PO TABS
800.0000 mg | ORAL_TABLET | Freq: Once | ORAL | Status: AC
  Administered 2013-09-25: 800 mg via ORAL
  Filled 2013-09-25: qty 1

## 2013-09-25 NOTE — Discharge Instructions (Signed)

## 2013-09-25 NOTE — ED Notes (Signed)
MD at the bedside  

## 2013-09-25 NOTE — ED Notes (Signed)
Patient given discharge instruction, verbalized understand. Patient ambulatory out of the department.  
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
## 2013-09-25 NOTE — ED Notes (Signed)
Pt ready for discharge, use MD assessment
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
## 2013-11-05 ENCOUNTER — Emergency Department (HOSPITAL_COMMUNITY)
Admission: EM | Admit: 2013-11-05 | Discharge: 2013-11-05 | Disposition: A | Discharge status: Home / Self Care | Payer: Self-pay | Attending: Emergency Medicine | Admitting: Emergency Medicine

## 2013-11-05 ENCOUNTER — Emergency Department (HOSPITAL_COMMUNITY): Payer: Self-pay

## 2013-11-05 ENCOUNTER — Encounter (HOSPITAL_COMMUNITY): Payer: Self-pay | Admitting: Emergency Medicine

## 2013-11-05 DIAGNOSIS — Z79899 Other long term (current) drug therapy: Secondary | ICD-10-CM | POA: Insufficient documentation

## 2013-11-05 DIAGNOSIS — Z791 Long term (current) use of non-steroidal anti-inflammatories (NSAID): Secondary | ICD-10-CM | POA: Insufficient documentation

## 2013-11-05 DIAGNOSIS — M545 Low back pain, unspecified: Secondary | ICD-10-CM

## 2013-11-05 DIAGNOSIS — G8929 Other chronic pain: Secondary | ICD-10-CM | POA: Insufficient documentation

## 2013-11-05 DIAGNOSIS — R52 Pain, unspecified: Secondary | ICD-10-CM | POA: Insufficient documentation

## 2013-11-05 LAB — URINALYSIS, ROUTINE W REFLEX MICROSCOPIC
Bilirubin Urine: NEGATIVE
GLUCOSE, UA: NEGATIVE mg/dL
HGB URINE DIPSTICK: NEGATIVE
Ketones, ur: NEGATIVE mg/dL
Leukocytes, UA: NEGATIVE
Nitrite: NEGATIVE
PH: 6 (ref 5.0–8.0)
PROTEIN: NEGATIVE mg/dL
Specific Gravity, Urine: 1.02 (ref 1.005–1.030)
Urobilinogen, UA: 0.2 mg/dL (ref 0.0–1.0)

## 2013-11-05 MED ORDER — KETOROLAC TROMETHAMINE 30 MG/ML IJ SOLN
30.0000 mg | Freq: Once | INTRAMUSCULAR | Status: AC
  Administered 2013-11-05: 30 mg via INTRAVENOUS
  Filled 2013-11-05: qty 1

## 2013-11-05 MED ORDER — HYDROMORPHONE HCL PF 1 MG/ML IJ SOLN
1.0000 mg | Freq: Once | INTRAMUSCULAR | Status: AC
  Administered 2013-11-05: 1 mg via INTRAVENOUS
  Filled 2013-11-05: qty 1

## 2013-11-05 MED ORDER — HYDROMORPHONE HCL PF 1 MG/ML IJ SOLN
1.0000 mg | Freq: Once | INTRAMUSCULAR | Status: AC
  Administered 2013-11-05: 1 mg via INTRAMUSCULAR
  Filled 2013-11-05: qty 1

## 2013-11-05 MED ORDER — METHOCARBAMOL 500 MG PO TABS
1000.0000 mg | ORAL_TABLET | Freq: Once | ORAL | Status: AC
  Administered 2013-11-05: 1000 mg via ORAL
  Filled 2013-11-05: qty 2

## 2013-11-05 MED ORDER — PREDNISONE 10 MG PO TABS: ORAL_TABLET | ORAL | Status: DC
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
## 2013-11-05 NOTE — Discharge Instructions (Signed)
Back Pain, Adult Low back pain is very common. About 1 in 5 people have back pain.The cause of low back pain is rarely dangerous. The pain often gets better over time.About half of people with a sudden onset of back pain feel better in just 2 weeks. About 8 in 10 people feel better by 6 weeks.  CAUSES Some common causes of back pain include:  Strain of the muscles or ligaments supporting the spine.  Wear and tear (degeneration) of the spinal discs.  Arthritis.  Direct injury to the back. DIAGNOSIS Most of the time, the direct cause of low back pain is not known.However, back pain can be treated effectively even when the exact cause of the pain is unknown.Answering your caregiver's questions about your overall health and symptoms is one of the most accurate ways to make sure the cause of your pain is not dangerous. If your caregiver needs more information, he or she may order lab work or imaging tests (X-rays or MRIs).However, even if imaging tests show changes in your back, this usually does not require surgery. HOME CARE INSTRUCTIONS For many people, back pain returns.Since low back pain is rarely dangerous, it is often a condition that people can learn to manageon their own.   Remain active. It is stressful on the back to sit or stand in one place. Do not sit, drive, or stand in one place for more than 30 minutes at a time. Take short walks on level surfaces as soon as pain allows.Try to increase the length of time you walk each day.  Do not stay in bed.Resting more than 1 or 2 days can delay your recovery.  Do not avoid exercise or work.Your body is made to move.It is not dangerous to be active, even though your back may hurt.Your back will likely heal faster if you return to being active before your pain is gone.  Pay attention to your body when you bend and lift. Many people have less discomfortwhen lifting if they bend their knees, keep the load close to their bodies,and  avoid twisting. Often, the most comfortable positions are those that put less stress on your recovering back.  Find a comfortable position to sleep. Use a firm mattress and lie on your side with your knees slightly bent. If you lie on your back, put a pillow under your knees.  Only take over-the-counter or prescription medicines as directed by your caregiver. Over-the-counter medicines to reduce pain and inflammation are often the most helpful.Your caregiver may prescribe muscle relaxant drugs.These medicines help dull your pain so you can more quickly return to your normal activities and healthy exercise.  Put ice on the injured area.  Put ice in a plastic bag.  Place a towel between your skin and the bag.  Leave the ice on for 15-20 minutes, 03-04 times a day for the first 2 to 3 days. After that, ice and heat may be alternated to reduce pain and spasms.  Ask your caregiver about trying back exercises and gentle massage. This may be of some benefit.  Avoid feeling anxious or stressed.Stress increases muscle tension and can worsen back pain.It is important to recognize when you are anxious or stressed and learn ways to manage it.Exercise is a great option. SEEK MEDICAL CARE IF:  You have pain that is not relieved with rest or medicine.  You have pain that does not improve in 1 week.  You have new symptoms.  You are generally not feeling well. SEEK   IMMEDIATE MEDICAL CARE IF:   You have pain that radiates from your back into your legs.  You develop new bowel or bladder control problems.  You have unusual weakness or numbness in your arms or legs.  You develop nausea or vomiting.  You develop abdominal pain.  You feel faint. Document Released: 05/10/2005 Document Revised: 11/09/2011 Document Reviewed: 09/28/2010 [employer] Patient Information 2014 [employer].  

## 2013-11-05 NOTE — Care Management Note (Signed)
ED/CM noted patient did not have health insurance and/or PCP listed in the computer.  Patient was given the [place] resource handout with information on the clinics, food pantries, and the handout for new health insurance sign-up.  Patient expressed appreciation for information received. 
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
## 2013-11-09 NOTE — ED Provider Notes (Signed)
Medical screening examination/treatment/procedure(s) were performed by non-physician practitioner and as supervising physician I was immediately available for consultation/collaboration.   EKG Interpretation None        Marysa Wessner L Marquese Burkland, MD 11/09/13 1547 

## 2014-12-17 ENCOUNTER — Encounter (HOSPITAL_COMMUNITY): Payer: Self-pay | Admitting: Emergency Medicine

## 2014-12-17 ENCOUNTER — Emergency Department (HOSPITAL_COMMUNITY)
Admission: EM | Admit: 2014-12-17 | Discharge: 2014-12-17 | Disposition: A | Discharge status: Home / Self Care | Payer: Self-pay | Attending: Emergency Medicine | Admitting: Emergency Medicine

## 2014-12-17 DIAGNOSIS — Z8619 Personal history of other infectious and parasitic diseases: Secondary | ICD-10-CM | POA: Insufficient documentation

## 2014-12-17 DIAGNOSIS — R109 Unspecified abdominal pain: Secondary | ICD-10-CM | POA: Insufficient documentation

## 2014-12-17 HISTORY — DX: Zoster without complications: B02.9

## 2014-12-17 MED ORDER — ACYCLOVIR 400 MG PO TABS: 800.0000 mg | ORAL_TABLET | Freq: Every day | ORAL | Status: DC

## 2014-12-17 MED ORDER — PREDNISONE 10 MG PO TABS: 20.0000 mg | ORAL_TABLET | Freq: Two times a day (BID) | ORAL | Status: DC

## 2014-12-17 NOTE — ED Notes (Signed)
Pt started having burning around right flank yesterday, has a hx of shingles and states that it feels like that

## 2014-12-17 NOTE — Discharge Instructions (Signed)
Acyclovir and prednisone as prescribed.  Return to the emergency department if symptoms significantly worsen or change.   Flank Pain Flank pain refers to pain that is located on the side of the body between the upper abdomen and the back. The pain may occur over a short period of time (acute) or may be long-term or reoccurring (chronic). It may be mild or severe. Flank pain can be caused by many things. CAUSES  Some of the more common causes of flank pain include:  Muscle strains.   Muscle spasms.   A disease of your spine (vertebral disk disease).   A lung infection (pneumonia).   Fluid around your lungs (pulmonary edema).   A kidney infection.   Kidney stones.   A very painful skin rash caused by the chickenpox virus (shingles).   Gallbladder disease.  HOME CARE INSTRUCTIONS  Home care will depend on the cause of your pain. In general,  Rest as directed by your caregiver.  Drink enough fluids to keep your urine clear or pale yellow.  Only take over-the-counter or prescription medicines as directed by your caregiver. Some medicines may help relieve the pain.  Tell your caregiver about any changes in your pain.  Follow up with your caregiver as directed. SEEK IMMEDIATE MEDICAL CARE IF:   Your pain is not controlled with medicine.   You have new or worsening symptoms.  Your pain increases.   You have abdominal pain.   You have shortness of breath.   You have persistent nausea or vomiting.   You have swelling in your abdomen.   You feel faint or pass out.   You have blood in your urine.  You have a fever or persistent symptoms for more than 2-3 days.  You have a fever and your symptoms suddenly get worse. MAKE SURE YOU:   Understand these instructions.  Will watch your condition.  Will get help right away if you are not doing well or get worse. Document Released: 07/01/2005 Document Revised: 02/02/2012 Document Reviewed:  12/23/2011 [employer] Patient Information 2015 [place], [place]. This information is not intended to replace advice given to you by your health care provider. Make sure you discuss any questions you have with your health care provider.

## 2014-12-17 NOTE — ED Provider Notes (Signed)
CSN: [id number]     Arrival date & time 12/17/14  2020 History  This chart was scribed for Geoffery Lyons, MD by Lyndel Safe, ED Scribe. This patient was seen in room APA10/[facility]10 and the patient's care was started 9:25 PM.   Chief Complaint  Patient presents with  . Herpes Zoster   The history is provided by the patient. No language interpreter was used.   HPI Comments: Cory Terry is a 49 y.o. male, with a PMhx of back pain and shingles, who presents to the Emergency Department complaining of a progressively worsening, constant, moderate, burning area to right flank that appeared 3 days ago. The pt has a history of the herpes zoster virus and reports his symptoms feel similar to past symptoms experienced with shingles. He additionally notes the painful area is in the same area as his shingles rash was with his past experience. He has applied hydrocortisone cream to the affected area without relief. Denies fever, rash, or abdominal pain.   Past Medical History  Diagnosis Date  . Back pain   . Shingles 2011   History reviewed. No pertinent past surgical history. History reviewed. No pertinent family history. History  Substance Use Topics  . Smoking status: Never Smoker   . Smokeless tobacco: Not on file  . Alcohol Use: Yes     Comment: occasionally    Review of Systems  Constitutional: Negative for fever.  Gastrointestinal: Negative for abdominal pain.  Skin: Negative for rash.   A complete 10 system review of systems was obtained and is otherwise negative except at noted in the HPI and PMH.  Allergies  Bee venom  Home Medications   Prior to Admission medications   Medication Sig Start Date End Date Taking? Authorizing Provider  OxyCODONE (OXYCONTIN) 20 mg T12A 12 hr tablet Take 20 mg by mouth every 12 (twelve) hours as needed.   Yes Historical Provider, MD   BP 151/78 mmHg  Pulse 63  Temp(Src) 98.4 F (36.9 C) (Oral)  Resp 18  Ht  (1.88 m)  Wt 186 lb (84.369  kg)  BMI 23.87 kg/m2  SpO2 96% Physical Exam  Constitutional: He appears well-developed and well-nourished. No distress.  HENT:  Head: Normocephalic.  Eyes: Conjunctivae are normal. Right eye exhibits no discharge. Left eye exhibits no discharge. No scleral icterus.  Neck: No JVD present.  Pulmonary/Chest: Effort normal. No respiratory distress.  Neurological: He is alert. Coordination normal.  Skin: Skin is warm. No rash noted. No erythema. No pallor.  Mild TTP to right flank.   Psychiatric: He has a normal mood and affect. His behavior is normal.  Nursing note and vitals reviewed.   ED Course  Procedures  DIAGNOSTIC STUDIES: Oxygen Saturation is 96% on RA, normal by my interpretation.    COORDINATION OF CARE: 9:29 PM Discussed treatment plan which includes to prescribed acyclovir  and prednisone   with pt. Pt acknowledges and agrees to plan.   Labs Review Labs Reviewed - No data to display  Imaging Review No results found.   EKG Interpretation None      MDM   Final diagnoses:  None    Will treat as shingles.  Abdomen is benign.  To return prn.    I personally performed the services described in this documentation, which was scribed in my presence. The recorded information has been reviewed and is accurate.    Geoffery Lyons, MD 12/18/14 2209

## 2015-03-12 ENCOUNTER — Other Ambulatory Visit (HOSPITAL_COMMUNITY): Payer: Self-pay | Admitting: Internal Medicine

## 2015-03-12 DIAGNOSIS — M5441 Lumbago with sciatica, right side: Secondary | ICD-10-CM

## 2015-03-12 DIAGNOSIS — M5442 Lumbago with sciatica, left side: Principal | ICD-10-CM

## 2015-03-27 ENCOUNTER — Ambulatory Visit (HOSPITAL_COMMUNITY): Payer: 59

## 2015-03-28 ENCOUNTER — Ambulatory Visit (HOSPITAL_COMMUNITY): Admission: RE | Admit: 2015-03-28 | Payer: 59 | Source: Ambulatory Visit

## 2015-04-18 ENCOUNTER — Other Ambulatory Visit (HOSPITAL_COMMUNITY): Payer: 59

## 2015-04-25 ENCOUNTER — Ambulatory Visit (HOSPITAL_COMMUNITY)
Admission: RE | Admit: 2015-04-25 | Discharge: 2015-04-25 | Disposition: A | Discharge status: Home / Self Care | Payer: 59 | Source: Ambulatory Visit | Attending: Internal Medicine | Admitting: Internal Medicine

## 2015-04-25 DIAGNOSIS — M5442 Lumbago with sciatica, left side: Principal | ICD-10-CM

## 2015-04-25 DIAGNOSIS — M5441 Lumbago with sciatica, right side: Secondary | ICD-10-CM

## 2015-12-01 ENCOUNTER — Emergency Department (HOSPITAL_COMMUNITY)
Admission: EM | Admit: 2015-12-01 | Discharge: 2015-12-01 | Disposition: A | Discharge status: Home / Self Care | Payer: 59 | Attending: Emergency Medicine | Admitting: Emergency Medicine

## 2015-12-01 ENCOUNTER — Encounter (HOSPITAL_COMMUNITY): Payer: Self-pay | Admitting: Emergency Medicine

## 2015-12-01 DIAGNOSIS — R1031 Right lower quadrant pain: Secondary | ICD-10-CM | POA: Diagnosis present

## 2015-12-01 DIAGNOSIS — K4091 Unilateral inguinal hernia, without obstruction or gangrene, recurrent: Secondary | ICD-10-CM | POA: Diagnosis not present

## 2015-12-01 MED ORDER — IBUPROFEN 800 MG PO TABS: 800.0000 mg | ORAL_TABLET | Freq: Three times a day (TID) | ORAL | Status: DC | PRN

## 2015-12-01 NOTE — Discharge Instructions (Signed)
Follow-up with Dr. Lovell SheehanJenkins in a week return if problems

## 2015-12-01 NOTE — ED Provider Notes (Signed)
CSN: [id number]     Arrival date & time 12/01/15  1715 History   First MD Initiated Contact with Patient 12/01/15 1818     Chief Complaint  Patient presents with  . Abdominal Pain     (Consider location/radiation/quality/duration/timing/severity/associated sxs/prior Treatment) Patient is a 50 y.o. male presenting with abdominal pain. The history is provided by the patient (Patient complains of right inguinal pain.).  Abdominal Pain Pain location: Right inguinal area. Pain quality: aching   Pain radiates to:  Does not radiate Pain severity:  Moderate Onset quality:  Sudden Timing:  Constant Progression:  Worsening Chronicity:  Recurrent Context: not alcohol use   Associated symptoms: no chest pain, no cough, no diarrhea, no fatigue and no hematuria     Past Medical History  Diagnosis Date  . Back pain   . Shingles 2011   History reviewed. No pertinent past surgical history. History reviewed. No pertinent family history. Social History  Substance Use Topics  . Smoking status: Never Smoker   . Smokeless tobacco: None  . Alcohol Use: Yes     Comment: occasionally    Review of Systems  Constitutional: Negative for appetite change and fatigue.  HENT: Negative for congestion, ear discharge and sinus pressure.   Eyes: Negative for discharge.  Respiratory: Negative for cough.   Cardiovascular: Negative for chest pain.  Gastrointestinal: Positive for abdominal pain. Negative for diarrhea.  Genitourinary: Negative for frequency and hematuria.  Musculoskeletal: Negative for back pain.  Skin: Negative for rash.  Neurological: Negative for seizures and headaches.  Psychiatric/Behavioral: Negative for hallucinations.      Allergies  Bee venom  Home Medications   Prior to Admission medications   Medication Sig Start Date End Date Taking? Authorizing Provider  acyclovir (ZOVIRAX) 400 MG tablet Take 2 tablets (800 mg total) by mouth 5 (five) times daily. 12/17/14   Geoffery Lyonsouglas  Delo, MD  ibuprofen (ADVIL,MOTRIN) 800 MG tablet Take 1 tablet (800 mg total) by mouth every 8 (eight) hours as needed for moderate pain. 12/01/15   Bethann BerkshireJoseph Lamine Laton, MD  OxyCODONE (OXYCONTIN) 20 mg T12A 12 hr tablet Take 20 mg by mouth every 12 (twelve) hours as needed.    Historical Provider, MD  predniSONE (DELTASONE) 10 MG tablet Take 2 tablets (20 mg total) by mouth 2 (two) times daily. 12/17/14   Geoffery Lyonsouglas Delo, MD   BP 141/97 mmHg  Pulse 63  Temp(Src) 98.1 F (36.7 C) (Oral)  Resp 20  Ht 6\' 2"  (1.88 m)  Wt 185 lb (83.915 kg)  BMI 23.74 kg/m2  SpO2 99% Physical Exam  Constitutional: He is oriented to person, place, and time. He appears well-developed.  HENT:  Head: Normocephalic.  Eyes: Conjunctivae and EOM are normal. No scleral icterus.  Neck: Neck supple. No thyromegaly present.  Cardiovascular: Normal rate and regular rhythm.  Exam reveals no gallop and no friction rub.   No murmur heard. Pulmonary/Chest: No stridor. He has no wheezes. He has no rales. He exhibits no tenderness.  Abdominal: He exhibits no distension. There is tenderness. There is no rebound.  Tenderness to right inguinal area. On exam patient has a right inguinal hernia no masses incarcerated  Musculoskeletal: Normal range of motion. He exhibits no edema.  Lymphadenopathy:    He has no cervical adenopathy.  Neurological: He is oriented to person, place, and time. He exhibits normal muscle tone. Coordination normal.  Skin: No rash noted. No erythema.  Psychiatric: He has a normal mood and affect. His behavior is normal.  ED Course  Procedures (including critical care time) Labs Review Labs Reviewed - No data to display  Imaging Review No results found. I have personally reviewed and evaluated these images and lab results as part of my medical decision-making.   EKG Interpretation None      MDM   Final diagnoses:  Unilateral recurrent inguinal hernia without obstruction or gangrene    Patient  sent home with Motrin. He is told not to do any heavy lifting for a week. Patient does not want to be put out of work. He is referred to general surgeon    Bethann Berkshire, MD 12/01/15 1840

## 2015-12-01 NOTE — ED Notes (Signed)
Pt c/o lower abd pain radiating into groin. EDP at bedside for exam.

## 2015-12-01 NOTE — ED Notes (Signed)
Pt states he has been having right lower quad abd pain for about a month.  States he has been doing heavy lifting at work.  Saw his pcp and was dx with a hernia, but states the pain is getting worse and more often.

## 2016-03-29 ENCOUNTER — Encounter (HOSPITAL_COMMUNITY): Payer: Self-pay | Admitting: Emergency Medicine

## 2016-03-29 ENCOUNTER — Emergency Department (HOSPITAL_COMMUNITY): Payer: 59

## 2016-03-29 ENCOUNTER — Emergency Department (HOSPITAL_COMMUNITY)
Admission: EM | Admit: 2016-03-29 | Discharge: 2016-03-30 | Disposition: A | Discharge status: Home / Self Care | Payer: 59 | Attending: Emergency Medicine | Admitting: Emergency Medicine

## 2016-03-29 DIAGNOSIS — S4991XA Unspecified injury of right shoulder and upper arm, initial encounter: Secondary | ICD-10-CM | POA: Diagnosis present

## 2016-03-29 DIAGNOSIS — Y929 Unspecified place or not applicable: Secondary | ICD-10-CM | POA: Insufficient documentation

## 2016-03-29 DIAGNOSIS — Y9372 Activity, wrestling: Secondary | ICD-10-CM | POA: Diagnosis not present

## 2016-03-29 DIAGNOSIS — M25511 Pain in right shoulder: Secondary | ICD-10-CM | POA: Diagnosis not present

## 2016-03-29 DIAGNOSIS — Y999 Unspecified external cause status: Secondary | ICD-10-CM | POA: Insufficient documentation

## 2016-03-29 DIAGNOSIS — X58XXXA Exposure to other specified factors, initial encounter: Secondary | ICD-10-CM | POA: Diagnosis not present

## 2016-03-29 NOTE — ED Triage Notes (Signed)
Pt states he has had pain in right shoulder for several months, tonight was wrestling and playing and had sudden severe pain

## 2016-03-30 ENCOUNTER — Encounter: Payer: Self-pay | Admitting: Orthopaedic Surgery

## 2016-03-30 ENCOUNTER — Telehealth: Payer: Self-pay | Admitting: Orthopaedic Surgery

## 2016-03-30 ENCOUNTER — Ambulatory Visit (INDEPENDENT_AMBULATORY_CARE_PROVIDER_SITE_OTHER): Payer: 59 | Admitting: Orthopaedic Surgery

## 2016-03-30 VITALS — BP 193/119 | HR 67 | Temp 97.7°F | Ht 73.0 in | Wt 183.0 lb

## 2016-03-30 DIAGNOSIS — M25511 Pain in right shoulder: Secondary | ICD-10-CM

## 2016-03-30 MED ORDER — NAPROXEN 500 MG PO TABS: 500.0000 mg | ORAL_TABLET | Freq: Two times a day (BID) | ORAL | 5 refills | Status: DC

## 2016-03-30 MED ORDER — OXYCODONE-ACETAMINOPHEN 7.5-325 MG PO TABS: 1.0000 | ORAL_TABLET | Freq: Four times a day (QID) | ORAL | 0 refills | Status: DC | PRN

## 2016-03-30 MED ORDER — OXYCODONE-ACETAMINOPHEN 5-325 MG PO TABS
1.0000 | ORAL_TABLET | Freq: Once | ORAL | Status: AC
  Administered 2016-03-30: 1 via ORAL
  Filled 2016-03-30: qty 1

## 2016-03-30 MED ORDER — OXYCODONE-ACETAMINOPHEN 5-325 MG PO TABS
2.0000 | ORAL_TABLET | Freq: Four times a day (QID) | ORAL | 0 refills | Status: DC | PRN
Start: 2016-03-30 — End: 2016-03-30
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
## 2016-03-30 NOTE — ED Provider Notes (Signed)
[facility]EM[facility] DEPT Provider Note   CSN: [id number] Arrival date & time: 03/29/16  2218     History   Chief Complaint Chief Complaint  Patient presents with  . Shoulder Injury    HPI Cory Terry is a 50 y.o. male.  HPI  50 y.o. male presents to the Emergency Department today complaining of right shoulder pain with onset today. Notes pain occurred after wrestling with friend. Pain was immediate in onset and rates pain 10/10. Notes pain worsened with movement. Ibuprofen with minimal relief. No N/V. No CP/SOB. No fevers. No numbness/tingling. Pt states hx of previous shoulder problems that he has not seen an orthopedics provider for in the past. No other symptoms noted.   Past Medical History:  Diagnosis Date  . Back pain   . Shingles 2011    There are no active problems to display for this patient.   History reviewed. No pertinent surgical history.   Home Medications    Prior to Admission medications   Medication Sig Start Date End Date Taking? Authorizing Provider  acyclovir (ZOVIRAX) 400 MG tablet Take 2 tablets (800 mg total) by mouth 5 (five) times daily. 12/17/14   Geoffery Lyonsouglas Delo, MD  ibuprofen (ADVIL,MOTRIN) 800 MG tablet Take 1 tablet (800 mg total) by mouth every 8 (eight) hours as needed for moderate pain. 12/01/15   Bethann BerkshireJoseph Zammit, MD  OxyCODONE (OXYCONTIN) 20 mg T12A 12 hr tablet Take 20 mg by mouth every 12 (twelve) hours as needed.    Historical Provider, MD  predniSONE (DELTASONE) 10 MG tablet Take 2 tablets (20 mg total) by mouth 2 (two) times daily. 12/17/14   Geoffery Lyonsouglas Delo, MD    Family History History reviewed. No pertinent family history.  Social History Social History  Substance Use Topics  . Smoking status: Never Smoker  . Smokeless tobacco: Never Used  . Alcohol use Yes     Comment: occasionally     Allergies   Bee venom   Review of Systems Review of Systems  Constitutional: Negative for fever.  Respiratory: Negative for shortness of  breath.   Cardiovascular: Negative for chest pain.  Musculoskeletal: Positive for arthralgias.  Skin: Negative for wound.   Physical Exam Updated Vital Signs BP 117/74 (BP Location: Left Arm)   Pulse 78   Temp 97.5 F (36.4 C) (Oral)   Resp 20   Ht 6\' 2"  (1.88 m)   Wt 78 kg   SpO2 100%   BMI 22.08 kg/m   Physical Exam  Constitutional: He is oriented to person, place, and time. Vital signs are normal. He appears well-developed and well-nourished.  HENT:  Head: Normocephalic.  Right Ear: Hearing normal.  Left Ear: Hearing normal.  Eyes: Conjunctivae and EOM are normal. Pupils are equal, round, and reactive to light.  Cardiovascular: Normal rate and regular rhythm.   Pulmonary/Chest: Effort normal.  Musculoskeletal:  Right Shoulder No obvious bony deformity. Pain with internal/external rotation as well as abduction. NVI with distal pulses appreciated. Sensation intact.   Neurological: He is alert and oriented to person, place, and time.  Skin: Skin is warm and dry.  Psychiatric: He has a normal mood and affect. His speech is normal and behavior is normal. Thought content normal.   ED Treatments / Results  Labs (all labs ordered are listed, but only abnormal results are displayed) Labs Reviewed - No data to display  EKG  EKG Interpretation None       Radiology Dg Shoulder Right  Result Date: 03/29/2016  CLINICAL DATA:  Right shoulder pain for several months. Acute onset of severe pain after wrestling with friend today. EXAM: RIGHT SHOULDER - 2+ VIEW COMPARISON:  04/16/2010 radiographs FINDINGS: Inferomedial humeral head spurring is noted slightly more prominent than on prior exam. No acute fracture nor bone destruction is seen. Narrowing of the impingement space the rotator cuff is noted. The adjacent ribs and lung are remarkable. 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 No acute osseous abnormality. Slight narrowing of  the impingement space for the rotator cuff. This may cause pain due to impingement with shoulder abduction. Electronically Signed   By: Tollie Ethavid  Kwon M.D.   On: 03/29/2016 23:27    Procedures Procedures (including critical care time)  Medications Ordered in ED Medications - No data to display   Initial Impression / Assessment and Plan / ED Course  I have reviewed the triage vital signs and the nursing notes.  Pertinent labs & imaging results that were available during my care of the patient were reviewed by me and considered in my medical decision making (see chart for details).  Clinical Course    Final Clinical Impressions(s) / ED Diagnoses  I have reviewed and evaluated the relevant imaging studies.  I have reviewed the relevant previous healthcare records.I obtained HPI from historian.  ED Course:  Assessment: Pt is a 49yM who presents with right shoulder pain today. On exam, pt in NAD. Nontoxic/nonseptic appearing. VSS. Afebrile. Lungs CTA. Heart RRR. Right shoulder with pain on ROM, especially with internal/external rotation as well as abduction. Imaging showed osteoarthritis of glenohumeral joint. No acute abnormalities. Possible shoulder impingement syndrome vs rotator cuff injury. Given analgesia in ED. Plan is to DC home with follow up to Orthopedics. I have reviewed the [place] Cont[place] Substance Reporting System. Given Rx #10 Percocet. At time of discharge, Patient is in no acute distress. Vital Signs are stable. Patient is able to ambulate. Patient able to tolerate PO.   Disposition/Plan:  DC Home Additional Verbal discharge instructions given and discussed with patient.  Pt Instructed to f/u with Ortho in the next week for evaluation and treatment of symptoms. Return precautions given Pt acknowledges and agrees with plan  Supervising Physician Gilda Creasehristopher J Pollina, MD   Final diagnoses:  Acute pain of right shoulder    New Prescriptions New Prescriptions     OXYCODONE-ACETAMINOPHEN (PERCOCET/ROXICET) 5-325 MG TABLET    Take 2 tablets by mouth every 6 (six) hours as needed for severe pain.     Audry Piliyler Kyira Volkert, PA-C 03/30/16 0122    Gilda Creasehristopher J Pollina, MD 03/30/16 [phone]

## 2016-03-30 NOTE — Telephone Encounter (Signed)
[place], regarding pain medication prescription:  oxyCODONE-acetaminophen (PERCOCET) 7.5-325 MG tablet 56 tablet    - States patient is already taking a stronger dose of pain medication by another doctor.  Please call pharmacist at [phone].

## 2016-03-30 NOTE — Telephone Encounter (Signed)
Patient's mom called about medications prescribed and sent to [facility] - said also uses [facility] in [place], due to prices, but said will leave as is, since medications already sent to [facility].

## 2016-04-13 ENCOUNTER — Ambulatory Visit: Payer: 59 | Admitting: Orthopaedic Surgery

## 2016-04-13 ENCOUNTER — Encounter: Payer: Self-pay | Admitting: Orthopaedic Surgery

## 2016-07-23 ENCOUNTER — Emergency Department (HOSPITAL_COMMUNITY): Payer: 59

## 2016-07-23 ENCOUNTER — Emergency Department (HOSPITAL_COMMUNITY)
Admission: EM | Admit: 2016-07-23 | Discharge: 2016-07-23 | Disposition: A | Discharge status: Home / Self Care | Payer: 59 | Attending: Emergency Medicine | Admitting: Emergency Medicine

## 2016-07-23 ENCOUNTER — Encounter (HOSPITAL_COMMUNITY): Payer: Self-pay | Admitting: Emergency Medicine

## 2016-07-23 DIAGNOSIS — Z79899 Other long term (current) drug therapy: Secondary | ICD-10-CM | POA: Diagnosis not present

## 2016-07-23 DIAGNOSIS — M79672 Pain in left foot: Secondary | ICD-10-CM | POA: Diagnosis not present

## 2016-07-23 MED ORDER — DICLOFENAC SODIUM 75 MG PO TBEC: 75.0000 mg | DELAYED_RELEASE_TABLET | Freq: Two times a day (BID) | ORAL | 0 refills | Status: DC

## 2016-07-23 NOTE — ED Triage Notes (Signed)
Reports waking up 2 days ago with left foot pain which is now radiating up ankle and back of leg.  Denies any injury.  No swelling noted.

## 2016-07-23 NOTE — ED Provider Notes (Signed)
[facility] Provider Note   CSN: [id number] Arrival date & time: 07/23/16  0820     History   Chief Complaint Chief Complaint  Patient presents with  . Ankle Pain    HPI Cory Terry is a 51 y.o. male.  HPI   Cory Terry is a 51 y.o. male who presents to the Emergency Department complaining of increased pain tot he left medial foot.  Pain present for several days.  Reports childhood hx of a "bony growth" to bilateral arches.  Pain to the area is new, but patient also reports having a new job and now required to wear steel toe boots.  Pain to the area is worse with weight bearing.  He has tried warm soaks to his foot without relief. He denies injury, redness, swelling and calf pain.   Past Medical History:  Diagnosis Date  . Back pain   . Shingles 2011    There are no active problems to display for this patient.   History reviewed. No pertinent surgical history.     Home Medications    Prior to Admission medications   Medication Sig Start Date End Date Taking? Authorizing Provider  acyclovir (ZOVIRAX) 400 MG tablet Take 2 tablets (800 mg total) by mouth 5 (five) times daily. 12/17/14   Geoffery Lyonsouglas Delo, MD  naproxen (NAPROSYN) 500 MG tablet Take 1 tablet (500 mg total) by mouth 2 (two) times daily with a meal. 03/30/16   Darreld McleanWayne Keeling, MD  oxyCODONE-acetaminophen (PERCOCET) 7.5-325 MG tablet Take 1 tablet by mouth every 6 (six) hours as needed for moderate pain or severe pain (Must last 14 days.Do not take and drive a car or operate machinery). 03/30/16   Darreld McleanWayne Keeling, MD  predniSONE (DELTASONE) 10 MG tablet Take 2 tablets (20 mg total) by mouth 2 (two) times daily. 12/17/14   Geoffery Lyonsouglas Delo, MD    Family History History reviewed. No pertinent family history.  Social History Social History  Substance Use Topics  . Smoking status: Never Smoker  . Smokeless tobacco: Never Used  . Alcohol use Yes     Comment: occasionally     Allergies   Bee  venom   Review of Systems Review of Systems  Constitutional: Negative for chills and fever.  Genitourinary: Negative for difficulty urinating and dysuria.  Musculoskeletal: Positive for arthralgias. Negative for joint swelling.  Skin: Negative for color change and wound.  Neurological: Negative for weakness and numbness.  All other systems reviewed and are negative.    Physical Exam Updated Vital Signs Ht 6\' 2"  (1.88 m)   Wt 84.8 kg   BMI 24.01 kg/m   Physical Exam  Constitutional: He is oriented to person, place, and time. He appears well-developed and well-nourished. No distress.  HENT:  Head: Normocephalic and atraumatic.  Cardiovascular: Normal rate, regular rhythm and intact distal pulses.   Pulmonary/Chest: Effort normal and breath sounds normal.  Musculoskeletal: Normal range of motion. He exhibits tenderness. He exhibits no edema.       Feet:  Focal tenderness of the medial left foot.  DP pulse is brisk,distal sensation intact.  No erythema, edema or tenderness of the Achilles tendon.  No proximal tenderness or edema  Neurological: He is alert and oriented to person, place, and time. He exhibits normal muscle tone. Coordination normal.  Skin: Skin is warm and dry. No rash noted.  Psychiatric: He has a normal mood and affect.  Nursing note and vitals reviewed.    ED Treatments /  Results  Labs (all labs ordered are listed, but only abnormal results are displayed) Labs Reviewed - No data to display  EKG  EKG Interpretation None       Radiology Dg Foot Complete Left  Result Date: 07/23/2016 CLINICAL DATA:  Bony outgrowth on the medial aspect of the right foot since birth has become painful in the past 3 days. No known injury. EXAM: LEFT FOOT - COMPLETE 3+ VIEW COMPARISON:  None. FINDINGS: The patient has a large accessory ossicle of the navicular. Mild irregularity of subchondral bone about the synchondrosis may be due to degenerative change. No acute bony or  joint abnormality is identified. Mineralization and alignment are normal. Soft tissues appear normal. 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 No acute abnormality. Electronically Signed   By: Drusilla Kanner M.D.   On: 07/23/2016 09:05    Procedures Procedures (including critical care time)  Medications Ordered in ED Medications - No data to display   Initial Impression / Assessment and Plan / ED Course  I have reviewed the triage vital signs and the nursing notes.  Pertinent labs & imaging results that were available during my care of the patient were reviewed by me and considered in my medical decision making (see chart for details).     Pt with bony outgrowth of the medial left foot.  XR neg for acute bony process.  NV intact.  No concerning sx's for infectious process.  Likely inflammatory.  Pt agrees to try insole in his shoes, ice, Rx for NSAID, referral given for podiatry.  Appears stable for d/c.    Final Clinical Impressions(s) / ED Diagnoses   Final diagnoses:  Arch pain of left foot    New Prescriptions Discharge Medication List as of 07/23/2016  9:27 AM    START taking these medications   Details  diclofenac (VOLTAREN) 75 MG EC tablet Take 1 tablet (75 mg total) by mouth 2 (two) times daily. Take with food, Starting Fri 07/23/2016, Print         Cory Terry, [place] 07/23/16 1610    Eber Hong, MD 07/25/16 [phone]

## 2016-07-23 NOTE — Discharge Instructions (Signed)
Apply ice packs on/off to your foot.  Try wearing an arch support insole in your shoes.  Call the foot doctor listed to arrange a follow-up appt if not improving.

## 2017-03-27 ENCOUNTER — Encounter (HOSPITAL_COMMUNITY): Payer: Self-pay

## 2017-03-27 ENCOUNTER — Emergency Department (HOSPITAL_COMMUNITY)
Admission: EM | Admit: 2017-03-27 | Discharge: 2017-03-27 | Disposition: A | Discharge status: Home / Self Care | Payer: Self-pay | Attending: Emergency Medicine | Admitting: Emergency Medicine

## 2017-03-27 ENCOUNTER — Emergency Department (HOSPITAL_COMMUNITY): Payer: Self-pay

## 2017-03-27 ENCOUNTER — Other Ambulatory Visit: Payer: Self-pay

## 2017-03-27 DIAGNOSIS — Z79899 Other long term (current) drug therapy: Secondary | ICD-10-CM | POA: Insufficient documentation

## 2017-03-27 DIAGNOSIS — M545 Low back pain: Secondary | ICD-10-CM | POA: Insufficient documentation

## 2017-03-27 MED ORDER — METHOCARBAMOL 500 MG PO TABS: 500.0000 mg | ORAL_TABLET | Freq: Every evening | ORAL | 0 refills | Status: DC | PRN

## 2017-03-27 MED ORDER — OXYCODONE-ACETAMINOPHEN 5-325 MG PO TABS
1.0000 | ORAL_TABLET | Freq: Once | ORAL | Status: AC
  Administered 2017-03-27: 1 via ORAL
  Filled 2017-03-27: qty 1

## 2017-03-27 MED ORDER — NAPROXEN 500 MG PO TABS: 500.0000 mg | ORAL_TABLET | Freq: Two times a day (BID) | ORAL | 0 refills | Status: DC
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
## 2017-12-29 ENCOUNTER — Encounter: Payer: Self-pay | Admitting: Gastroenterology

## 2018-03-16 ENCOUNTER — Encounter: Payer: Self-pay | Admitting: Gastroenterology

## 2018-04-04 ENCOUNTER — Ambulatory Visit: Payer: Self-pay | Admitting: Gastroenterology

## 2018-06-28 ENCOUNTER — Encounter: Payer: Self-pay | Admitting: Gastroenterology

## 2018-06-28 ENCOUNTER — Telehealth: Payer: Self-pay | Admitting: Gastroenterology

## 2018-06-28 ENCOUNTER — Ambulatory Visit: Payer: Self-pay | Admitting: Gastroenterology

## 2018-06-28 NOTE — Telephone Encounter (Signed)
PATIENT WAS A NO SHOW AND LETTER SENT  °

## 2019-04-06 ENCOUNTER — Emergency Department (HOSPITAL_COMMUNITY): Payer: BC Managed Care – PPO

## 2019-04-06 ENCOUNTER — Emergency Department (HOSPITAL_COMMUNITY)
Admission: EM | Admit: 2019-04-06 | Discharge: 2019-04-06 | Disposition: A | Discharge status: Home / Self Care | Payer: BC Managed Care – PPO | Attending: Emergency Medicine | Admitting: Emergency Medicine

## 2019-04-06 ENCOUNTER — Other Ambulatory Visit: Payer: Self-pay

## 2019-04-06 ENCOUNTER — Encounter (HOSPITAL_COMMUNITY): Payer: Self-pay

## 2019-04-06 DIAGNOSIS — Y9389 Activity, other specified: Secondary | ICD-10-CM | POA: Insufficient documentation

## 2019-04-06 DIAGNOSIS — S93602A Unspecified sprain of left foot, initial encounter: Secondary | ICD-10-CM | POA: Diagnosis not present

## 2019-04-06 DIAGNOSIS — X500XXA Overexertion from strenuous movement or load, initial encounter: Secondary | ICD-10-CM | POA: Diagnosis not present

## 2019-04-06 DIAGNOSIS — Y999 Unspecified external cause status: Secondary | ICD-10-CM | POA: Diagnosis not present

## 2019-04-06 DIAGNOSIS — Y929 Unspecified place or not applicable: Secondary | ICD-10-CM | POA: Insufficient documentation

## 2019-04-06 DIAGNOSIS — S99922A Unspecified injury of left foot, initial encounter: Secondary | ICD-10-CM | POA: Diagnosis present

## 2019-04-06 NOTE — Discharge Instructions (Addendum)
Your x-rays today was negative for fracture or dislocation.  You most likely have a sprain of your foot and ankle.  You can wear the brace as needed for support when weightbearing.  Elevate your foot and apply ice packs on and off.  Call Dr. Ruthe Mannan office in 1 week to arrange follow-up appointment if not improving.

## 2019-04-06 NOTE — ED Triage Notes (Signed)
Pt reports stepped off bottom step and twisted left foot last night.  C/o pain in top of left foot and left ankle.

## 2019-04-06 NOTE — ED Provider Notes (Signed)
[facility] EMERGENCY DEPARTMENT Provider Note   CSN: [id number] Arrival date & time: 04/06/19  2956     History   Chief Complaint Chief Complaint  Patient presents with  . Foot Pain    HPI Cory Terry is a 53 y.o. male.     HPI   Cory Terry is a 53 y.o. male with past medical history of chronic back pain who presents to the Emergency Department complaining of pain to his left ankle and dorsal foot.  He reports a mechanical twisting injury of his foot that occurred last evening while stepping off some steps.  He states he "caught himself" and did not fall.  Reports throbbing pain to the top of his foot.  He did apply ice last night without relief.  Pain to the foot and ankle is worse with weightbearing and improves at rest.  He denies tingling or numbness of his foot or toes, hip or knee pain, neck pain and no increased back pain.  He takes oxycodone for his chronic back pain and he is scheduled for surgery to his back in December.   Past Medical History:  Diagnosis Date  . Back pain   . Back pain   . Shingles 2011    There are no active problems to display for this patient.   History reviewed. No pertinent surgical history.      Home Medications    Prior to Admission medications   Medication Sig Start Date End Date Taking? Authorizing Provider  cyclobenzaprine (FLEXERIL) 10 MG tablet Take 10 mg by mouth 3 (three) times daily as needed. 02/27/19  Yes [provider]  Oxycodone HCl 10 MG TABS Take 10-20 mg by mouth every 4 (four) hours as needed. 02/27/19  Yes [provider]  methocarbamol (ROBAXIN) 500 MG tablet Take 1 tablet (500 mg total) at bedtime as needed by mouth for muscle spasms. Patient not taking: Reported on 04/06/2019 03/27/17   Avie Echevaria B, PA-C  naproxen (NAPROSYN) 500 MG tablet Take 1 tablet (500 mg total) 2 (two) times daily with a meal by mouth. Patient not taking: Reported on 04/06/2019 03/27/17   Dossie Der    Family History No family history on file.  Social History Social History   Tobacco Use  . Smoking status: Never Smoker  . Smokeless tobacco: Never Used  Substance Use Topics  . Alcohol use: Yes    Comment: occasionally  . Drug use: No     Allergies   Bee venom   Review of Systems Review of Systems  Constitutional: Negative for chills and fever.  Respiratory: Negative for shortness of breath.   Cardiovascular: Negative for chest pain.  Gastrointestinal: Negative for nausea and vomiting.  Musculoskeletal: Positive for arthralgias (Left foot and ankle pain). Negative for back pain, joint swelling and neck pain.  Skin: Negative for color change and wound.  Neurological: Negative for weakness and numbness.     Physical Exam Updated Vital Signs BP 138/90 (BP Location: Left Arm)   Pulse 78   Temp 98.4 F (36.9 C) (Oral)   Resp 20   Ht 6\' 2"  (1.88 m)   Wt 79.8 kg   SpO2 96%   BMI 22.60 kg/m   Physical Exam Vitals signs and nursing note reviewed.  Constitutional:      General: He is not in acute distress.    Appearance: Normal appearance. He is well-developed. He is not ill-appearing.  HENT:     Head:  Atraumatic.  Cardiovascular:     Rate and Rhythm: Normal rate and regular rhythm.     Pulses: Normal pulses.  Pulmonary:     Effort: Pulmonary effort is normal.     Breath sounds: Normal breath sounds.  Musculoskeletal:        General: Tenderness and signs of injury present. No swelling or deformity.     Comments: Tenderness to palpation of the anterior left ankle and dorsal left foot.  No ecchymosis or erythema noted.  No bony deformity noted.  No proximal tenderness.  Skin:    General: Skin is warm.     Capillary Refill: Capillary refill takes less than 2 seconds.     Findings: No erythema or rash.  Neurological:     General: No focal deficit present.     Mental Status: He is alert.     Sensory: No sensory deficit.     Motor: No abnormal muscle  tone.      ED Treatments / Results  Labs (all labs ordered are listed, but only abnormal results are displayed) Labs Reviewed - No data to display  EKG None  Radiology Dg Ankle Complete Left  Result Date: 04/06/2019 CLINICAL DATA:  Pain secondary to a twisting injury last night. EXAM: LEFT ANKLE COMPLETE - 3+ VIEW COMPARISON:  Foot radiographs dated 07/23/2016 FINDINGS: There is no evidence of fracture, dislocation, or joint effusion. There is no evidence of arthropathy or other focal bone abnormality. Soft tissues are unremarkable. IMPRESSION: Normal exam. Electronically Signed   By: Francene Boyers M.D.   On: 04/06/2019 10:47   Dg Foot Complete Left  Result Date: 04/06/2019 CLINICAL DATA:  Pain of the ankle and foot after a twisting injury last night. EXAM: LEFT FOOT - COMPLETE 3+ VIEW COMPARISON:  Radiographs dated 07/23/2016 FINDINGS: There is no evidence of fracture or dislocation. There is no evidence of arthropathy or other focal bone abnormality. Soft tissues are unremarkable. Os naviculare. IMPRESSION: Normal exam. Electronically Signed   By: Francene Boyers M.D.   On: 04/06/2019 10:46    Procedures Procedures (including critical care time)  Medications Ordered in ED Medications - No data to display   Initial Impression / Assessment and Plan / ED Course  I have reviewed the triage vital signs and the nursing notes.  Pertinent labs & imaging results that were available during my care of the patient were reviewed by me and considered in my medical decision making (see chart for details).        Patient with mechanical injury of the left ankle and foot.  X-rays are negative for fracture or dislocation.  Remains neurovascularly intact.  Likely sprain.  Patient given ASO brace, applied by nursing.  He agrees to treatment plan with elevation and ice and close orthopedic follow-up in 1 week if not improving.  Final Clinical Impressions(s) / ED Diagnoses   Final diagnoses:   Sprain of left foot, initial encounter    ED Discharge Orders    None       Pauline Aus, PA-C 04/06/19 1107    Donnetta Hutching, MD 04/07/19 [phone]

## 2020-11-26 ENCOUNTER — Emergency Department (HOSPITAL_COMMUNITY)
Admission: EM | Admit: 2020-11-26 | Discharge: 2020-11-26 | Discharge status: Against Medical Advice | Payer: BC Managed Care – PPO

## 2020-11-27 ENCOUNTER — Emergency Department (HOSPITAL_COMMUNITY)
Admission: EM | Admit: 2020-11-27 | Discharge: 2020-11-27 | Disposition: A | Discharge status: Home / Self Care | Payer: No Typology Code available for payment source | Attending: Emergency Medicine | Admitting: Emergency Medicine

## 2020-11-27 ENCOUNTER — Encounter (HOSPITAL_COMMUNITY): Payer: Self-pay | Admitting: Emergency Medicine

## 2020-11-27 ENCOUNTER — Other Ambulatory Visit: Payer: Self-pay

## 2020-11-27 ENCOUNTER — Emergency Department (HOSPITAL_COMMUNITY): Payer: No Typology Code available for payment source

## 2020-11-27 DIAGNOSIS — S39012A Strain of muscle, fascia and tendon of lower back, initial encounter: Secondary | ICD-10-CM | POA: Diagnosis not present

## 2020-11-27 DIAGNOSIS — Y92410 Unspecified street and highway as the place of occurrence of the external cause: Secondary | ICD-10-CM | POA: Insufficient documentation

## 2020-11-27 DIAGNOSIS — S199XXA Unspecified injury of neck, initial encounter: Secondary | ICD-10-CM | POA: Diagnosis present

## 2020-11-27 DIAGNOSIS — S161XXA Strain of muscle, fascia and tendon at neck level, initial encounter: Secondary | ICD-10-CM | POA: Insufficient documentation

## 2020-11-27 MED ORDER — NAPROXEN 500 MG PO TABS: 500.0000 mg | ORAL_TABLET | Freq: Two times a day (BID) | ORAL | 0 refills | Status: DC

## 2020-11-27 MED ORDER — KETOROLAC TROMETHAMINE 60 MG/2ML IM SOLN
60.0000 mg | Freq: Once | INTRAMUSCULAR | Status: AC
  Administered 2020-11-27: 60 mg via INTRAMUSCULAR
  Filled 2020-11-27: qty 2
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
## 2020-11-27 NOTE — ED Notes (Signed)
Pt going to xray  

## 2021-08-03 ENCOUNTER — Encounter: Payer: Self-pay | Admitting: Orthopaedic Surgery

## 2021-08-21 ENCOUNTER — Encounter: Payer: Self-pay | Admitting: Orthopedic Surgery

## 2021-08-21 ENCOUNTER — Ambulatory Visit: Payer: Self-pay | Admitting: Orthopedic Surgery

## 2021-08-24 ENCOUNTER — Encounter: Payer: Self-pay | Admitting: Orthopedic Surgery

## 2022-05-13 ENCOUNTER — Other Ambulatory Visit: Payer: Self-pay

## 2022-05-13 ENCOUNTER — Emergency Department (HOSPITAL_COMMUNITY): Payer: Self-pay

## 2022-05-13 ENCOUNTER — Emergency Department (HOSPITAL_COMMUNITY)
Admission: EM | Admit: 2022-05-13 | Discharge: 2022-05-13 | Disposition: A | Discharge status: Home / Self Care | Payer: Self-pay | Attending: Emergency Medicine | Admitting: Emergency Medicine

## 2022-05-13 ENCOUNTER — Encounter (HOSPITAL_COMMUNITY): Payer: Self-pay | Admitting: *Deleted

## 2022-05-13 DIAGNOSIS — J101 Influenza due to other identified influenza virus with other respiratory manifestations: Secondary | ICD-10-CM | POA: Insufficient documentation

## 2022-05-13 DIAGNOSIS — Z20822 Contact with and (suspected) exposure to covid-19: Secondary | ICD-10-CM | POA: Insufficient documentation

## 2022-05-13 LAB — RESP PANEL BY RT-PCR (RSV, FLU A&B, COVID)  RVPGX2
Influenza A by PCR: POSITIVE — AB
Influenza B by PCR: NEGATIVE
Resp Syncytial Virus by PCR: NEGATIVE
SARS Coronavirus 2 by RT PCR: NEGATIVE

## 2022-05-13 MED ORDER — ALBUTEROL SULFATE HFA 108 (90 BASE) MCG/ACT IN AERS
2.0000 | INHALATION_SPRAY | RESPIRATORY_TRACT | Status: DC | PRN
  Administered 2022-05-13: 2 via RESPIRATORY_TRACT
  Filled 2022-05-13: qty 6.7

## 2022-05-13 MED ORDER — AEROCHAMBER PLUS FLO-VU MEDIUM MISC
1.0000 | Freq: Once | Status: AC
  Administered 2022-05-13: 1

## 2022-05-13 NOTE — Discharge Instructions (Addendum)
Rest make sure you are drinking plenty of fluids.  I recommend your continued treatment plan to help with bodyaches.  You may use the albuterol inhaler you received here taking 2 puffs every 4 hours if you are wheezing or feel short of breath.  If your shortness of breath gets worse and you do not feel like this medicine is helping please return here for a recheck of your symptoms, additionally if you start feeling increasingly weak you should also get rechecked as well.  Expect to be out of work the rest of this week with this illness.

## 2022-05-13 NOTE — ED Triage Notes (Signed)
Pt c/o generalized body aches with chills that started yesterday while at work

## 2022-05-13 NOTE — ED Provider Notes (Signed)
[facility] EMERGENCY DEPARTMENT Provider Note   CSN: [id number] Arrival date & time: 05/13/22  0757     History  Chief Complaint  Patient presents with   Generalized Body Aches    Cory Terry is a 56 y.o. male  presenting with a 1 day history of uri type symptoms which includes nasal congestion with clear rhinorrhea, generalized body aches,  cough sometimes productive of a clear to light green sputum and generalized fatigue along with intermittent wheezing and sob.  He reports asthma as a child, nonsmoker.   Symptoms do not include  chest pain,  Nausea, vomiting or diarrhea.  The patient has taken theraflu prior to arrival with some improvement in his symptoms.  The history is provided by the patient.       Home Medications Prior to Admission medications   Medication Sig Start Date End Date Taking? Authorizing Provider  ibuprofen (ADVIL) 200 MG tablet Take 200 mg by mouth every 6 (six) hours as needed for fever, headache or mild pain.   Yes [provider]  oxyCODONE-acetaminophen (PERCOCET) 10-325 MG tablet Take 1 tablet by mouth every 4 (four) hours as needed for pain. 04/21/22  Yes [provider]  tamsulosin (FLOMAX) 0.4 MG CAPS capsule Take 0.4 mg by mouth daily. 12/28/21  Yes [provider]      Allergies    Bee venom    Review of Systems   Review of Systems  Constitutional:  Negative for fever.  HENT:  Positive for congestion, postnasal drip and rhinorrhea. Negative for sore throat.   Eyes: Negative.   Respiratory:  Positive for cough, chest tightness and wheezing. Negative for shortness of breath.   Cardiovascular:  Negative for chest pain.  Gastrointestinal:  Negative for abdominal pain, nausea and vomiting.  Genitourinary: Negative.   Musculoskeletal:  Positive for myalgias. Negative for arthralgias, joint swelling and neck pain.  Skin: Negative.  Negative for rash and wound.  Neurological:  Negative for dizziness, weakness,  light-headedness, numbness and headaches.  Psychiatric/Behavioral: Negative.      Physical Exam Updated Vital Signs BP (!) 152/106   Pulse (!) 102   Temp 99 F (37.2 C) (Oral)   Resp 18   Ht 6\' 2"  (1.88 m)   Wt 78 kg   SpO2 97%   BMI 22.08 kg/m  Physical Exam Constitutional:      Appearance: He is well-developed.  HENT:     Head: Normocephalic and atraumatic.     Right Ear: Tympanic membrane and ear canal normal.     Left Ear: Tympanic membrane and ear canal normal.     Nose: Mucosal edema, congestion and rhinorrhea present.     Mouth/Throat:     Mouth: Mucous membranes are moist.     Pharynx: Oropharynx is clear. Uvula midline. No oropharyngeal exudate or posterior oropharyngeal erythema.     Tonsils: No tonsillar abscesses.  Eyes:     Conjunctiva/sclera: Conjunctivae normal.  Cardiovascular:     Rate and Rhythm: Normal rate.     Heart sounds: Normal heart sounds.  Pulmonary:     Effort: Pulmonary effort is normal. No respiratory distress.     Breath sounds: Rhonchi present. No wheezing or rales.     Comments: Trace rhonchi right base. Abdominal:     Palpations: Abdomen is soft.     Tenderness: There is no abdominal tenderness.  Musculoskeletal:        General: Normal range of motion.  Skin:    General:  Skin is warm and dry.     Findings: No rash.  Neurological:     Mental Status: He is alert and oriented to person, place, and time.     ED Results / Procedures / Treatments   Labs (all labs ordered are listed, but only abnormal results are displayed) Labs Reviewed  RESP PANEL BY RT-PCR (RSV, FLU A&B, COVID)  RVPGX2 - Abnormal; Notable for the following components:      Result Value   Influenza A by PCR POSITIVE (*)    All other components within normal limits    EKG None  Radiology DG Chest Portable 1 View  Result Date: 05/13/2022 CLINICAL DATA:  Cough, wheeze EXAM: PORTABLE CHEST 1 VIEW COMPARISON:  06/14/2011 FINDINGS: The heart size and  mediastinal contours are within normal limits. Mild diffuse interstitial pulmonary opacity. The visualized skeletal structures are unremarkable. IMPRESSION: Mild diffuse interstitial pulmonary opacity, consistent with edema or atypical/viral infection. No focal airspace opacity. Electronically Signed   By: Jearld Lesch M.D.   On: 05/13/2022 12:02    Procedures Procedures    Medications Ordered in ED Medications  albuterol (VENTOLIN HFA) 108 (90 Base) MCG/ACT inhaler 2 puff (2 puffs Inhalation Given 05/13/22 1216)  AeroChamber Plus Flo-Vu Medium MISC 1 each (1 each Other Given 05/13/22 1206)    ED Course/ Medical Decision Making/ A&P                           Medical Decision Making Patient presenting with a 1 day history of flulike symptoms.  He is in no respiratory distress here, he does endorse having intermittent wheezing at home but not currently.  His vital signs are reassuring, he has no respiratory distress and a pulse ox greater than 95% with exertion.  He is positive for influenza A.  He was given albuterol MDI and instructed in its use.  After receiving the series states his cough was more productive.  He had no  wheezing on exam.  Home instructions were discussed, return precautions were also outlined.  As needed follow-up is anticipated.  Amount and/or Complexity of Data Reviewed External Data Reviewed: labs.    Details: Positive for influenza A Radiology: ordered and independent interpretation performed.    Details: Agree with interpretation, no focal pneumonia.  Viral process.  Risk Prescription drug management.           Final Clinical Impression(s) / ED Diagnoses Final diagnoses:  Influenza A    Rx / DC Orders ED Discharge Orders     None         Victoriano Lain 05/13/22 1434    Glyn Ade, MD 05/14/22 1520

## 2022-05-13 NOTE — ED Notes (Signed)
Pt ambulated in hallway without difficulty, O2 remained above 96%.

## 2022-11-14 ENCOUNTER — Encounter (HOSPITAL_COMMUNITY): Payer: Self-pay

## 2022-11-14 ENCOUNTER — Other Ambulatory Visit: Payer: Self-pay

## 2022-11-14 ENCOUNTER — Emergency Department (HOSPITAL_COMMUNITY)
Admission: EM | Admit: 2022-11-14 | Discharge: 2022-11-14 | Disposition: A | Discharge status: Home / Self Care | Payer: Self-pay | Attending: Emergency Medicine | Admitting: Emergency Medicine

## 2022-11-14 ENCOUNTER — Emergency Department (HOSPITAL_COMMUNITY): Payer: Self-pay

## 2022-11-14 DIAGNOSIS — M25562 Pain in left knee: Secondary | ICD-10-CM | POA: Insufficient documentation

## 2022-11-14 MED ORDER — IBUPROFEN 800 MG PO TABS
800.0000 mg | ORAL_TABLET | Freq: Once | ORAL | Status: AC
  Administered 2022-11-14: 800 mg via ORAL
  Filled 2022-11-14: qty 1

## 2022-11-14 NOTE — ED Provider Notes (Signed)
[facility]-EMERGENCY DEPT [facility] Emergency Department Provider Note MRN:  [id number]  Arrival date & time: 11/14/22     Chief Complaint   Knee Pain   History of Present Illness   Cory Terry is a 57 y.o. year-old male with no pertinent past medical history presenting to the ED with chief complaint of knee pain.  Left knee pain and sensation of instability for the past 2 or 3 days.  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  The pain and instability is worse if he has been immobile for a few hours, such as waking up from sleep.  Denies trauma, no rash, no fever, no other complaints.  Review of Systems  A thorough review of systems was obtained and all systems are negative except as noted in the HPI and PMH.   Patient's Health History    Past Medical History:  Diagnosis Date   Back pain    Back pain    Shingles 2011    History reviewed. No pertinent surgical history.  History reviewed. No pertinent family history.  Social History   Socioeconomic History   Marital status: Divorced    Spouse name: Not on file   Number of children: Not on file   Years of education: Not on file   Highest education level: Not on file  Occupational History   Not on file  Tobacco Use   Smoking status: Never   Smokeless tobacco: Never  Vaping Use   Vaping Use: Never used  Substance and Sexual Activity   Alcohol use: Yes    Comment: occasionally   Drug use: No   Sexual activity: Not on file  Other Topics Concern   Not on file  Social History Narrative   Not on file   Social Determinants of Health   Financial Resource Strain: Not on file  Food Insecurity: Not on file  Transportation Needs: Not on file  Physical Activity: Not on file  Stress: Not on file  Social Connections: Not on file  Intimate Partner Violence: Not on file     Physical Exam   Vitals:   11/14/22 0610  BP: (!) 149/87  Pulse: 71  Resp: 18  Temp: 98 F (36.7 C)  SpO2: 98%    CONSTITUTIONAL:  Well-appearing, NAD NEURO/PSYCH:  Alert and oriented x 3, no focal deficits EYES:  eyes equal and reactive ENT/NECK:  no LAD, no JVD CARDIO: Regular rate, well-perfused, normal S1 and S2 PULM:  CTAB no wheezing or rhonchi GI/GU:  non-distended, non-tender MSK/SPINE:  No gross deformities, no edema SKIN:  no rash, atraumatic   *Additional and/or pertinent findings included in MDM below  Diagnostic and Interventional Summary    EKG Interpretation  Date/Time:    Ventricular Rate:    PR Interval:    QRS Duration:   QT Interval:    QTC Calculation:   R Axis:     Text Interpretation:         Labs Reviewed - No data to display  DG Knee Complete 4 Views Left  Final Result      Medications  ibuprofen (ADVIL) tablet 800 mg (800 mg Oral Given 11/14/22 4098)     Procedures  /  Critical Care Procedures  ED Course and Medical Decision Making  Initial Impression and Ddx Legs appear normal, patient has intact/normal range of motion, no increased warmth, no erythema.  Favoring osteoarthritis based on history.  Obtaining screening x-ray.  Past medical/surgical history that increases complexity of  ED encounter: Chronic back pain  Interpretation of Diagnostics I personally reviewed the knee x-ray and my interpretation is as follows: No fracture or other abnormalities    Patient Reassessment and Ultimate Disposition/Management     Appropriate for discharge.  Patient management required discussion with the following services or consulting groups:  None  Complexity of Problems Addressed Acute complicated illness or Injury  Additional Data Reviewed and Analyzed Further history obtained from: None  Additional Factors Impacting ED Encounter Risk None  Elmer Sow. Pilar Plate, MD [facility] Emergency Medicine [facility] [email or web address]  Final Clinical Impressions(s) / ED Diagnoses     ICD-10-CM   1. Acute pain of left knee  M25.562       ED Discharge  Orders     None        Discharge Instructions Discussed with and Provided to Patient:     Discharge Instructions      You were evaluated in the Emergency Department and after careful evaluation, we did not find any emergent condition requiring admission or further testing in the hospital.  Your exam/testing today is overall reassuring.  Use the brace for comfort/support, continue Tylenol or Motrin at home for discomfort.  If still having issues after 2 weeks would recommend follow-up with an orthopedic specialist.  Please return to the Emergency Department if you experience any worsening of your condition.   Thank you for allowing [place] to be a part of your care.       Sabas Sous, MD 11/14/22 [phone]

## 2022-11-14 NOTE — ED Triage Notes (Signed)
Patient from home for L knee pain. Reports this has been going on for 2 days and it is getting worse. States his L knee will give out on him and he has to catch himself. Patient alert and oriented, ambu with altered gait

## 2022-11-14 NOTE — Discharge Instructions (Signed)
You were evaluated in the Emergency Department and after careful evaluation, we did not find any emergent condition requiring admission or further testing in the hospital.  Your exam/testing today is overall reassuring.  Use the brace for comfort/support, continue Tylenol or Motrin at home for discomfort.  If still having issues after 2 weeks would recommend follow-up with an orthopedic specialist.  Please return to the Emergency Department if you experience any worsening of your condition.   Thank you for allowing [place] to be a part of your care.

## 2022-11-14 NOTE — ED Notes (Signed)
ED Provider at bedside. 
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
## 2023-06-11 ENCOUNTER — Other Ambulatory Visit: Payer: Self-pay

## 2023-06-11 ENCOUNTER — Emergency Department (HOSPITAL_COMMUNITY)
Admission: EM | Admit: 2023-06-11 | Discharge: 2023-06-11 | Disposition: A | Discharge status: Home / Self Care | Payer: Self-pay | Attending: Emergency Medicine | Admitting: Emergency Medicine

## 2023-06-11 ENCOUNTER — Emergency Department (HOSPITAL_COMMUNITY): Payer: Self-pay

## 2023-06-11 ENCOUNTER — Encounter (HOSPITAL_COMMUNITY): Payer: Self-pay

## 2023-06-11 DIAGNOSIS — M25552 Pain in left hip: Secondary | ICD-10-CM

## 2023-06-11 DIAGNOSIS — M1612 Unilateral primary osteoarthritis, left hip: Secondary | ICD-10-CM | POA: Insufficient documentation

## 2023-06-11 MED ORDER — ACETAMINOPHEN 500 MG PO TABS
1000.0000 mg | ORAL_TABLET | Freq: Once | ORAL | Status: AC
  Administered 2023-06-11: 1000 mg via ORAL
  Filled 2023-06-11: qty 2

## 2023-06-11 MED ORDER — FAMOTIDINE 40 MG PO TABS: 40.0000 mg | ORAL_TABLET | Freq: Every day | ORAL | 0 refills | Status: DC

## 2023-06-11 MED ORDER — IBUPROFEN 200 MG PO TABS: 400.0000 mg | ORAL_TABLET | Freq: Four times a day (QID) | ORAL | 2 refills | Status: AC | PRN

## 2023-06-11 MED ORDER — KETOROLAC TROMETHAMINE 30 MG/ML IJ SOLN
30.0000 mg | Freq: Once | INTRAMUSCULAR | Status: AC
  Administered 2023-06-11: 30 mg via INTRAMUSCULAR
  Filled 2023-06-11: qty 1

## 2023-06-11 NOTE — ED Provider Notes (Signed)
[place] EMERGENCY DEPARTMENT AT [facility] Provider Note  CSN: [id number] Arrival date & time: 06/11/23 4098  Chief Complaint(s) Hip Pain (Left )  HPI Cory Terry is a 58 y.o. male here today for left hip pain.  Patient has a history of chronic back pain, does take oxycodone for this.  He says that he has been having increasing pain in his left hip.  He noticed after trimming some trees at work yesterday, he went home and had increased pain in the area.  He denies any trauma.  Has been able to ambulate, but has been limping.  He denies any pain, or swelling in his lower leg.   Past Medical History Past Medical History:  Diagnosis Date   Back pain    Back pain    Shingles 2011   There are no active problems to display for this patient.  Home Medication(s) Prior to Admission medications   Medication Sig Start Date End Date Taking? Authorizing Provider  famotidine (PEPCID) 40 MG tablet Take 1 tablet (40 mg total) by mouth daily. 06/11/23 07/11/23 Yes Anders Simmonds T, DO  ibuprofen (ADVIL) 200 MG tablet Take 2 tablets (400 mg total) by mouth every 6 (six) hours as needed. 06/11/23  Yes Anders Simmonds T, DO  oxyCODONE-acetaminophen (PERCOCET) 10-325 MG tablet Take 1 tablet by mouth every 4 (four) hours as needed for pain. 04/21/22   [provider]  tamsulosin (FLOMAX) 0.4 MG CAPS capsule Take 0.4 mg by mouth daily. 12/28/21   [provider]                                                                                                                                    Past Surgical History History reviewed. No pertinent surgical history. Family History History reviewed. No pertinent family history.  Social History Social History   Tobacco Use   Smoking status: Never   Smokeless tobacco: Never  Vaping Use   Vaping status: Never Used  Substance Use Topics   Alcohol use: Yes    Comment: occasionally   Drug use: No   Allergies Bee  venom  Review of Systems Review of Systems  Physical Exam Vital Signs  I have reviewed the triage vital signs BP (!) 156/101   Pulse 83   Temp 98.6 F (37 C) (Oral)   Resp 17   Ht 6\' 2"  (1.88 m)   Wt 80.7 kg   SpO2 98%   BMI 22.85 kg/m   Physical Exam Vitals reviewed.  Cardiovascular:     Rate and Rhythm: Normal rate.     Pulses: Normal pulses.  Musculoskeletal:        General: No swelling or deformity. Normal range of motion.     Comments: Patient with tenderness on the left greater tuberosity.  Pain with passive range of motion.  Neurological:     Mental Status: He is alert.  ED Results and Treatments Labs (all labs ordered are listed, but only abnormal results are displayed) Labs Reviewed - No data to display                                                                                                                        Radiology DG Hip Unilat With Pelvis 2-3 Views Left Result Date: 06/11/2023 CLINICAL DATA:  Developed pain while trimming trees yesterday. No fall or specific injury. EXAM: DG HIP (WITH OR WITHOUT PELVIS) 2-3V LEFT COMPARISON:  07/11/2019. FINDINGS: No fracture or bone lesion. Moderate left hip joint space narrowing, predominantly superiorly, associated with subchondral cystic change and areas of sclerosis. Mild subchondral sclerosis and cystic change along the superior right acetabulum. Right hip joint normally spaced and aligned small marginal osteophytes from the base of the left femoral head. SI joints and pubic symphysis are normally spaced and aligned. Soft tissues are unremarkable. Left hip joint arthropathic changes have progressed when compared to the prior radiographs. Right hip appears unchanged IMPRESSION: 1. No fracture or acute finding. 2. Moderate left hip joint degenerative/arthropathic changes, progressed from the prior radiographs. Electronically Signed   By: Amie Portland M.D.   On: 06/11/2023 09:07    Pertinent labs & imaging  results that were available during my care of the patient were reviewed by me and considered in my medical decision making (see MDM for details).  Medications Ordered in ED Medications  ketorolac (TORADOL) 30 MG/ML injection 30 mg (30 mg Intramuscular Given 06/11/23 0901)  acetaminophen (TYLENOL) tablet 1,000 mg (1,000 mg Oral Given 06/11/23 0902)                                                                                                                                     Procedures Procedures  (including critical care time)  Medical Decision Making / ED Course   This patient presents to the ED for concern of left hip pain, this involves an extensive number of treatment options, and is a complaint that carries with it a high risk of complications and morbidity.  The differential diagnosis includes musculoskeletal injury, arthritis, less likely to infection, less likely DVT, less likely arterial injury.  MDM: Patient with good pulses, no deformity, no swelling of the lower extremity.  Patient with point tenderness over bony prominence.  Likely arthritis, versus musculoskeletal injury.  Plain films of the patient's left hip ordered.  Will provide Toradol and Tylenol here.  Reassessment 9:25 AM-patient's x-rays consistent with arthritis.  This fits the patient history and clinical exam.  Will provide referral to orthopedic surgery.  Patient feeling better after Toradol.   Additional history obtained: -Additional history obtained from  -External records from outside source obtained and reviewed including: Chart review including previous notes, labs, imaging, consultation notes   Lab Tests: -I ordered, reviewed, and interpreted labs.   The pertinent results include:   Labs Reviewed - No data to display    EKG   EKG Interpretation Date/Time:    Ventricular Rate:    PR Interval:    QRS Duration:    QT Interval:    QTC Calculation:   R Axis:      Text Interpretation:            Imaging Studies ordered: I ordered imaging studies including plain films of the left hip I independently visualized and interpreted imaging. I agree with the radiologist interpretation   Medicines ordered and prescription drug management: Meds ordered this encounter  Medications   ketorolac (TORADOL) 30 MG/ML injection 30 mg   acetaminophen (TYLENOL) tablet 1,000 mg   ibuprofen (ADVIL) 200 MG tablet    Sig: Take 2 tablets (400 mg total) by mouth every 6 (six) hours as needed.    Dispense:  60 tablet    Refill:  2   famotidine (PEPCID) 40 MG tablet    Sig: Take 1 tablet (40 mg total) by mouth daily.    Dispense:  30 tablet    Refill:  0    -I have reviewed the patients home medicines and have made adjustments as needed   Reevaluation: After the interventions noted above, I reevaluated the patient and found that they have :improved  Co morbidities that complicate the patient evaluation  Past Medical History:  Diagnosis Date   Back pain    Back pain    Shingles 2011          Final Clinical Impression(s) / ED Diagnoses Final diagnoses:  Left hip pain  Primary osteoarthritis of left hip     @PCDICTATION @    Anders Simmonds T, DO 06/11/23 0932

## 2023-06-11 NOTE — Discharge Instructions (Addendum)
Your x-ray showed worsening arthritis in your left hip.  The best medicines for this are Motrin and Tylenol.  Do not take any ibuprofen/Motrin today as one of the medicines that you received in the emergency department works the same way.  Tomorrow, you can take 400 mg of ibuprofen every 6 hours, 1000 mg of Tylenol every 8 hours.  I have also sent you a prescription for medication called famotidine.  Some of the medicines like ibuprofen can be a little bit harsh on your stomach and this medicine will help protect it a bit.  You can take 40 mg of famotidine each day.  I have included a referral for an orthopedic doctor.  Sometimes they can recommend things such as injections in the joint, or surgery if they think he would be a good candidate.  Try to rest your leg for the next few days.

## 2023-06-11 NOTE — ED Triage Notes (Addendum)
Patient come in by POV. 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 Stated "I've had pain in this hip in the past but never this bad."

## 2023-06-11 NOTE — ED Notes (Signed)
Patient transported to X-ray 

## 2023-09-01 ENCOUNTER — Other Ambulatory Visit: Payer: Self-pay

## 2023-09-01 ENCOUNTER — Observation Stay (HOSPITAL_COMMUNITY)
Admission: EM | Admit: 2023-09-01 | Discharge: 2023-09-02 | Disposition: A | Discharge status: Home / Self Care | Payer: Self-pay | Attending: Family Medicine | Admitting: Family Medicine

## 2023-09-01 ENCOUNTER — Emergency Department (HOSPITAL_COMMUNITY): Payer: Self-pay

## 2023-09-01 ENCOUNTER — Encounter (HOSPITAL_COMMUNITY): Payer: Self-pay

## 2023-09-01 DIAGNOSIS — F1290 Cannabis use, unspecified, uncomplicated: Secondary | ICD-10-CM | POA: Insufficient documentation

## 2023-09-01 DIAGNOSIS — I639 Cerebral infarction, unspecified: Principal | ICD-10-CM | POA: Insufficient documentation

## 2023-09-01 DIAGNOSIS — F1120 Opioid dependence, uncomplicated: Secondary | ICD-10-CM | POA: Insufficient documentation

## 2023-09-01 DIAGNOSIS — Z79899 Other long term (current) drug therapy: Secondary | ICD-10-CM | POA: Insufficient documentation

## 2023-09-01 DIAGNOSIS — K219 Gastro-esophageal reflux disease without esophagitis: Secondary | ICD-10-CM | POA: Insufficient documentation

## 2023-09-01 DIAGNOSIS — G8929 Other chronic pain: Secondary | ICD-10-CM | POA: Insufficient documentation

## 2023-09-01 DIAGNOSIS — M549 Dorsalgia, unspecified: Secondary | ICD-10-CM | POA: Insufficient documentation

## 2023-09-01 DIAGNOSIS — M545 Low back pain, unspecified: Secondary | ICD-10-CM

## 2023-09-01 DIAGNOSIS — N400 Enlarged prostate without lower urinary tract symptoms: Secondary | ICD-10-CM | POA: Insufficient documentation

## 2023-09-01 DIAGNOSIS — Z96642 Presence of left artificial hip joint: Secondary | ICD-10-CM | POA: Insufficient documentation

## 2023-09-01 HISTORY — DX: Benign prostatic hyperplasia without lower urinary tract symptoms: N40.0

## 2023-09-01 HISTORY — DX: Gastro-esophageal reflux disease without esophagitis: K21.9

## 2023-09-01 LAB — COMPREHENSIVE METABOLIC PANEL WITH GFR
ALT: 35 U/L (ref 0–44)
AST: 27 U/L (ref 15–41)
Albumin: 4.2 g/dL (ref 3.5–5.0)
Alkaline Phosphatase: 83 U/L (ref 38–126)
Anion gap: 9 (ref 5–15)
BUN: 12 mg/dL (ref 6–20)
CO2: 24 mmol/L (ref 22–32)
Calcium: 9.6 mg/dL (ref 8.9–10.3)
Chloride: 105 mmol/L (ref 98–111)
Creatinine, Ser: 0.9 mg/dL (ref 0.61–1.24)
GFR, Estimated: >60 mL/min (ref >60)
Glucose, Bld: 96 mg/dL (ref 70–99)
Potassium: 3.8 mmol/L (ref 3.5–5.1)
Sodium: 138 mmol/L (ref 135–145)
Total Bilirubin: 0.6 mg/dL (ref 0.0–1.2)
Total Protein: 7.9 g/dL (ref 6.5–8.1)

## 2023-09-01 LAB — DIFFERENTIAL
Abs Immature Granulocytes: 0.02 10*3/uL (ref 0.00–0.07)
Basophils Absolute: 0 10*3/uL (ref 0.0–0.1)
Basophils Relative: 0 %
Eosinophils Absolute: 0 10*3/uL (ref 0.0–0.5)
Eosinophils Relative: 0 %
Immature Granulocytes: 0 %
Lymphocytes Relative: 16 %
Lymphs Abs: 1.4 10*3/uL (ref 0.7–4.0)
Monocytes Absolute: 0.4 10*3/uL (ref 0.1–1.0)
Monocytes Relative: 5 %
Neutro Abs: 6.8 10*3/uL (ref 1.7–7.7)
Neutrophils Relative %: 79 %

## 2023-09-01 LAB — CBC
HCT: 47.7 % (ref 39.0–52.0)
Hemoglobin: 15.7 g/dL (ref 13.0–17.0)
MCH: 32.1 pg (ref 26.0–34.0)
MCHC: 32.9 g/dL (ref 30.0–36.0)
MCV: 97.5 fL (ref 80.0–100.0)
Platelets: 284 10*3/uL (ref 150–400)
RBC: 4.89 MIL/uL (ref 4.22–5.81)
RDW: 13.7 % (ref 11.5–15.5)
WBC: 8.7 10*3/uL (ref 4.0–10.5)
nRBC: 0 % (ref 0.0–0.2)

## 2023-09-01 LAB — RAPID URINE DRUG SCREEN, HOSP PERFORMED
Amphetamines: NOT DETECTED
Barbiturates: NOT DETECTED
Benzodiazepines: NOT DETECTED
Cocaine: POSITIVE — AB
Opiates: NOT DETECTED
Tetrahydrocannabinol: POSITIVE — AB

## 2023-09-01 LAB — URINALYSIS, ROUTINE W REFLEX MICROSCOPIC
Bilirubin Urine: NEGATIVE
Glucose, UA: NEGATIVE mg/dL
Hgb urine dipstick: NEGATIVE
Ketones, ur: NEGATIVE mg/dL
Leukocytes,Ua: NEGATIVE
Nitrite: NEGATIVE
Protein, ur: NEGATIVE mg/dL
Specific Gravity, Urine: 1.03 (ref 1.005–1.030)
pH: 7 (ref 5.0–8.0)

## 2023-09-01 LAB — CBG MONITORING, ED: Glucose-Capillary: 112 mg/dL — ABNORMAL HIGH (ref 70–99)

## 2023-09-01 LAB — PROTIME-INR
INR: 0.9 (ref 0.8–1.2)
Prothrombin Time: 12.2 s (ref 11.4–15.2)

## 2023-09-01 LAB — ETHANOL: Alcohol, Ethyl (B): <10 mg/dL (ref <10)

## 2023-09-01 LAB — APTT: aPTT: 26 s (ref 24–36)

## 2023-09-01 MED ORDER — ACETAMINOPHEN 650 MG RE SUPP: 650.0000 mg | RECTAL | Status: DC | PRN

## 2023-09-01 MED ORDER — IOHEXOL 350 MG/ML SOLN
75.0000 mL | Freq: Once | INTRAVENOUS | Status: AC | PRN
  Administered 2023-09-01: 75 mL via INTRAVENOUS

## 2023-09-01 MED ORDER — ATORVASTATIN CALCIUM 40 MG PO TABS
40.0000 mg | ORAL_TABLET | Freq: Every evening | ORAL | Status: DC
  Administered 2023-09-01: 40 mg via ORAL
  Filled 2023-09-01: qty 1

## 2023-09-01 MED ORDER — PANTOPRAZOLE SODIUM 40 MG PO TBEC
40.0000 mg | DELAYED_RELEASE_TABLET | Freq: Every evening | ORAL | Status: DC
  Administered 2023-09-01: 40 mg via ORAL
  Filled 2023-09-01: qty 1

## 2023-09-01 MED ORDER — ASPIRIN 325 MG PO TABS
325.0000 mg | ORAL_TABLET | Freq: Every day | ORAL | Status: DC
Start: 2023-09-01 — End: 2023-09-01

## 2023-09-01 MED ORDER — ASPIRIN 81 MG PO TBEC
81.0000 mg | DELAYED_RELEASE_TABLET | Freq: Every day | ORAL | Status: DC
  Administered 2023-09-02: 81 mg via ORAL
  Filled 2023-09-01: qty 1

## 2023-09-01 MED ORDER — OXYCODONE HCL 5 MG PO TABS: 15.0000 mg | ORAL_TABLET | Freq: Four times a day (QID) | ORAL | Status: DC | PRN

## 2023-09-01 MED ORDER — STROKE: EARLY STAGES OF RECOVERY BOOK
Freq: Once | Status: AC
  Filled 2023-09-01: qty 1

## 2023-09-01 MED ORDER — TAMSULOSIN HCL 0.4 MG PO CAPS
0.4000 mg | ORAL_CAPSULE | Freq: Every day | ORAL | Status: DC
  Administered 2023-09-01: 0.4 mg via ORAL
  Filled 2023-09-01: qty 1

## 2023-09-01 MED ORDER — CLOPIDOGREL BISULFATE 75 MG PO TABS
75.0000 mg | ORAL_TABLET | Freq: Every day | ORAL | Status: DC
  Administered 2023-09-02: 75 mg via ORAL
  Filled 2023-09-01: qty 1

## 2023-09-01 MED ORDER — ACETAMINOPHEN 160 MG/5ML PO SOLN: 650.0000 mg | ORAL | Status: DC | PRN

## 2023-09-01 MED ORDER — ENOXAPARIN SODIUM 40 MG/0.4ML IJ SOSY
40.0000 mg | PREFILLED_SYRINGE | INTRAMUSCULAR | Status: DC
  Administered 2023-09-01: 40 mg via SUBCUTANEOUS
  Filled 2023-09-01: qty 0.4

## 2023-09-01 MED ORDER — CLOPIDOGREL BISULFATE 75 MG PO TABS
300.0000 mg | ORAL_TABLET | Freq: Once | ORAL | Status: AC
  Administered 2023-09-01: 300 mg via ORAL
  Filled 2023-09-01: qty 4

## 2023-09-01 MED ORDER — ASPIRIN 300 MG RE SUPP: 300.0000 mg | Freq: Every day | RECTAL | Status: DC

## 2023-09-01 MED ORDER — ACETAMINOPHEN 325 MG PO TABS
650.0000 mg | ORAL_TABLET | ORAL | Status: DC | PRN
  Administered 2023-09-02: 650 mg via ORAL
  Filled 2023-09-01: qty 2

## 2023-09-01 MED ORDER — SODIUM CHLORIDE 0.9 % IV SOLN: INTRAVENOUS | Status: DC

## 2023-09-01 MED ORDER — SENNOSIDES-DOCUSATE SODIUM 8.6-50 MG PO TABS: 1.0000 | ORAL_TABLET | Freq: Every evening | ORAL | Status: DC | PRN

## 2023-09-01 NOTE — ED Triage Notes (Signed)
 Patient come in POV from PCP office, Patient stated "RT right face and arm lost of feeling/ numbness, Slurring speech. Denise Headache and blurry vision. LWK 10:30."

## 2023-09-01 NOTE — H&P (Addendum)
 History and Physical  [facility]  Cory Terry ZOX:[id number] DOB: [birth year] DOA: 09/01/2023  PCP: Elfredia Nevins, MD  Patient coming from: Home  Level of care: Telemetry  I have personally briefly reviewed patient's old medical records in [facility]  Chief Complaint: slurred speech  HPI: Cory Terry is a 58 year old male with history of chronic back pain and chronic left hip pain (says he needs a left hip replacement), GERD, BPH, marijuana user, reports he started having numbness on the right side of his face and arm that started yesterday evening around 10:30 PM.  He was also having difficulty with his slurred speech and difficulty using his right hand. He did not have symptoms in the right lower extremity.  He said that he has been under a lof of stress with his job and he had eaten a burger and taco yesterday with a lot of sodium content.  He decided to come to the emergency department because his symptoms had not resolved but did report that they were little bit more improved.  He was evaluated in the ED today with findings of small scattered foci of acute infarct in the left frontoparietal lobes involving the cortex and subcortical white matter as well as the centrum semiovale.  He had a CTA of the head and neck performed with no emergent findings of severe stenosis.  He is being admitted for acute stroke workup and management.    Past Medical History:  Diagnosis Date   Back pain    Back pain    BPH (benign prostatic hyperplasia)    GERD (gastroesophageal reflux disease)    Shingles 2011    History reviewed. No pertinent surgical history.   reports that he has never smoked. He has never used smokeless tobacco. He reports current alcohol use. He reports that he does not use drugs.  Allergies  Allergen Reactions   Bee Venom Swelling    History reviewed. No pertinent family history.  Prior to Admission medications   Medication Sig Start Date End Date Taking?  Authorizing Provider  ibuprofen (ADVIL) 200 MG tablet Take 2 tablets (400 mg total) by mouth every 6 (six) hours as needed. Patient taking differently: Take 600 mg by mouth every 6 (six) hours as needed for mild pain (pain score 1-3). 06/11/23  Yes Anders Simmonds T, DO  oxyCODONE (ROXICODONE) 15 MG immediate release tablet Take 15 mg by mouth 4 (four) times daily as needed for pain. 07/30/23  Yes [provider]  tamsulosin (FLOMAX) 0.4 MG CAPS capsule Take 0.4 mg by mouth daily. 12/28/21  Yes [provider]    Physical Exam: Vitals:   09/01/23 1211 09/01/23 1400 09/01/23 1522 09/01/23 1553  BP:  (!) 159/106 (!) 138/98 (!) 160/118  Pulse:  81 70 98  Resp: 18 20 16 18   Temp:    99.7 F (37.6 C)  TempSrc:    Oral  SpO2:  96% 100% 100%  Weight:      Height:        Constitutional: NAD, calm, comfortable Eyes: PERRL, lids and conjunctivae normal ENMT: Mucous membranes are moist. Posterior pharynx clear of any exudate or lesions.Normal dentition.  Neck: normal, supple, no masses, no thyromegaly Respiratory: clear to auscultation bilaterally, no wheezing, no crackles. Normal respiratory effort. No accessory muscle use.  Cardiovascular: normal s1, s2 sounds, no murmurs / rubs / gallops. No extremity edema. 2+ pedal pulses. No carotid bruits.  Abdomen: no tenderness, no masses palpated. No hepatosplenomegaly.  Bowel sounds positive.  Musculoskeletal: no clubbing / cyanosis. No joint deformity upper and lower extremities. Good ROM, no contractures. Normal muscle tone.  Skin: no rashes, lesions, ulcers. No induration Neurologic: CN 2-12 grossly intact. Sensation intact, DTR normal. Mild right hemiparesis.   Psychiatric: Normal judgment and insight. Alert and oriented x 3. Normal mood.   Labs on Admission: I have personally reviewed following labs and imaging studies  CBC: Recent Labs  Lab 09/01/23 0848  WBC 8.7  NEUTROABS 6.8  HGB 15.7  HCT 47.7  MCV 97.5  PLT 284    Basic Metabolic Panel: Recent Labs  Lab 09/01/23 0848  NA 138  K 3.8  CL 105  CO2 24  GLUCOSE 96  BUN 12  CREATININE 0.90  CALCIUM 9.6   GFR: Estimated Creatinine Clearance: 103.4 mL/min (by C-G formula based on SCr of 0.9 mg/dL). Liver Function Tests: Recent Labs  Lab 09/01/23 0848  AST 27  ALT 35  ALKPHOS 83  BILITOT 0.6  PROT 7.9  ALBUMIN 4.2   No results for input(s): "LIPASE", "AMYLASE" in the last 168 hours. No results for input(s): "AMMONIA" in the last 168 hours. Coagulation Profile: Recent Labs  Lab 09/01/23 0848  INR 0.9   Cardiac Enzymes: No results for input(s): "CKTOTAL", "CKMB", "CKMBINDEX", "TROPONINI" in the last 168 hours. BNP (last 3 results) No results for input(s): "PROBNP" in the last 8760 hours. HbA1C: No results for input(s): "HGBA1C" in the last 72 hours. CBG: Recent Labs  Lab 09/01/23 0845  GLUCAP 112*   Lipid Profile: No results for input(s): "CHOL", "HDL", "LDLCALC", "TRIG", "CHOLHDL", "LDLDIRECT" in the last 72 hours. Thyroid Function Tests: No results for input(s): "TSH", "T4TOTAL", "FREET4", "T3FREE", "THYROIDAB" in the last 72 hours. Anemia Panel: No results for input(s): "VITAMINB12", "FOLATE", "FERRITIN", "TIBC", "IRON", "RETICCTPCT" in the last 72 hours. Urine analysis:    Component Value Date/Time   COLORURINE YELLOW 11/05/2013 0945   APPEARANCEUR CLEAR 11/05/2013 0945   LABSPEC 1.020 11/05/2013 0945   PHURINE 6.0 11/05/2013 0945   GLUCOSEU NEGATIVE 11/05/2013 0945   HGBUR NEGATIVE 11/05/2013 0945   BILIRUBINUR NEGATIVE 11/05/2013 0945   KETONESUR NEGATIVE 11/05/2013 0945   PROTEINUR NEGATIVE 11/05/2013 0945   UROBILINOGEN 0.2 11/05/2013 0945   NITRITE NEGATIVE 11/05/2013 0945   LEUKOCYTESUR NEGATIVE 11/05/2013 0945    Radiological Exams on Admission: MR BRAIN WO CONTRAST Result Date: 09/01/2023 CLINICAL DATA:  Neuro deficit, concern for stroke. Slurred speech, right-sided face and arm numbness. EXAM: MRI  HEAD WITHOUT CONTRAST TECHNIQUE: Multiplanar, multiecho pulse sequences of the brain and surrounding structures were obtained without intravenous contrast. COMPARISON:  Same day CTA head and neck. FINDINGS: Brain: There are a few scattered small foci of acute infarct in the posterior left frontal lobe involving the cortex and subcortical white matter as well as the centrum semiovale. There are small infarcts noted involving the cortex along the lateral aspect of the left pre and postcentral gyri. No evidence of intracranial hemorrhage. Scattered FLAIR signal abnormality in the white matter particularly within the bifrontal subcortical white matter. Small remote infarct in the right cerebellum. No mass lesion or midline shift. Normal appearance of midline structures. The basilar cisterns are patent. No extra-axial fluid collections. Ventricles: Normal size and configuration of the ventricles. Vascular: Skull base flow voids are visualized. Skull and upper cervical spine: No focal abnormality. Sinuses/Orbits: Orbits are symmetric. Mild mucosal thickening in the right anterior ethmoid air cells. Other: Mastoid air cells are clear. 1.1 cm slightly T1 hyperintense  focus within the nasopharynx near midline with hypointense appearance on T2 and FLAIR images. IMPRESSION: Small scattered foci of acute infarct in the left frontoparietal lobes involving the cortex and subcortical white matter as well as the centrum semiovale. Involvement of the lateral aspect of the left precentral and postcentral gyri. No significant mass effect or midline shift. Nonspecific white matter signal abnormality likely reflecting mild chronic microvascular ischemic changes. Small remote infarct in the right cerebellum. 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 Consider nonemergent correlation with direct visualization. Electronically Signed   By: Emily Filbert M.D.    On: 09/01/2023 14:38   CT ANGIO HEAD NECK W WO CM Result Date: 09/01/2023 CLINICAL DATA:  Slurred speech and right-sided face/arm loss of feeling/numbness. EXAM: CT ANGIOGRAPHY HEAD AND NECK WITH AND WITHOUT CONTRAST TECHNIQUE: Multidetector CT imaging of the head and neck was performed using the standard protocol during bolus administration of intravenous contrast. Multiplanar CT image reconstructions and MIPs were obtained to evaluate the vascular anatomy. Carotid stenosis measurements (when applicable) are obtained utilizing NASCET criteria, using the distal internal carotid diameter as the denominator. RADIATION DOSE REDUCTION: This exam was performed according to the departmental dose-optimization program which includes automated exposure control, adjustment of the mA and/or kV according to patient size and/or use of iterative reconstruction technique. CONTRAST:  75mL OMNIPAQUE IOHEXOL 350 MG/ML SOLN COMPARISON:  None Available. FINDINGS: CT HEAD FINDINGS Brain: No evidence of acute infarction, hemorrhage, hydrocephalus, extra-axial collection or mass lesion/mass effect. No visible white matter disease. Vascular: No hyperdense vessel or unexpected calcification. Skull: Normal. Negative for fracture or focal lesion. Sinuses/Orbits: No acute finding. Review of the MIP images confirms the above findings CTA NECK FINDINGS Aortic arch: Normal with 4 vessel branching Right carotid system: Vessels are smoothly contoured and diffusely patent. Left carotid system: Vessels are smoothly contoured and diffusely patent. No significant atheromatous change Vertebral arteries: The vertebral and proximal subclavian arteries are smoothly contoured and widely patent. Mild dominance of the right vertebral artery. Skeleton: No acute or aggressive finding Other neck: No acute finding Upper chest: Clear apical lungs Review of the MIP images confirms the above findings CTA HEAD FINDINGS Anterior circulation: Atheromatous  calcification of the cavernous carotids. No branch occlusion, beading, or proximal flow reducing stenosis. No evidence of aneurysm. Arterial detail is diminished by bolus timing which is preferentially venous. Posterior circulation: The vertebral and basilar arteries are smoothly contoured and diffusely patent. No branch occlusion, beading, or aneurysm. Venous sinuses: Diffusely patent Anatomic variants: None significant Review of the MIP images confirms the above findings IMPRESSION: No emergent finding or explanation for symptoms. Mild atherosclerosis without irregularity or significant stenosis of major arteries in the head and neck. Electronically Signed   By: Tiburcio Pea M.D.   On: 09/01/2023 10:38   EKG: Independently reviewed. NSR no acute ST-T wave abnormalities   Assessment/Plan Principal Problem:   Acute CVA (cerebrovascular accident) (HCC) Active Problems:   BPH (benign prostatic hyperplasia)   GERD (gastroesophageal reflux disease)   Chronic back pain    Acute CVA  - pt being admitted for further stroke work up - CTA of the head and neck did not show any emergent severe stenosis - Bedside swallow evaluation pending, if passed start diet  - Check 2D echocardiogram - Check A1c and fasting lipid panel - Continue IV fluid hydration overnight - discussed with neurologist, load plavix today, start aspirin 81 mg, plavix 75 mg tomorrow -  Atorvastatin 40 mg ordered - PT/OT/SLP evaluation requested - Inpatient neurology consultation requested  BPH  - resumed home tamsulosin daily with supper  GERD - pantoprazole 40 mg daily for GI protection   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  Radiologist said to consider nonemergent correlation with direct visualization. - will refer out to ENT at discharge, we don't have ENT coverage at this hospital    DVT prophylaxis: enoxaparin   Code Status: Full   Family Communication:   Disposition Plan: TBD   Consults called: neurology   Admission status: OBV   Time spent: 56 mins  Level of care: Telemetry Standley Dakins MD [facility] How to contact the [facility] Attending or Consulting provider 7A - 7P or covering provider during after hours 7P -7A, for this patient?  Check the care team in [facility] and look for a) attending/consulting [facility] provider listed and b) the [facility] team listed Log into [email or web address] and use [place]'s universal password to access. If you do not have the password, please contact the hospital operator. Locate the [facility] provider you are looking for under [facility] and page to a number that you can be directly reached. If you still have difficulty reaching the provider, please page the [facility] (Director on Call) for the Hospitalists listed on amion for assistance.   If 7PM-7AM, please contact night-coverage [email or web address] Password [email or web address]  09/01/2023, 3:55 PM

## 2023-09-01 NOTE — ED Notes (Signed)
 Patient transported to CT

## 2023-09-01 NOTE — Hospital Course (Addendum)
 58 year old male with history of chronic back pain and chronic left hip pain (says he needs a left hip replacement), GERD, BPH, marijuana user, reports he started having numbness on the right side of his face and arm that started yesterday evening around 10:30 PM.  He was also having difficulty with his slurred speech and difficulty using his right hand. He did not have symptoms in the right lower extremity.  He said that he has been under a lof of stress with his job and he had eaten a burger and taco yesterday with a lot of sodium content.  He decided to come to the emergency department because his symptoms had not resolved but did report that they were little bit more improved.  He was evaluated in the ED today with findings of small scattered foci of acute infarct in the left frontoparietal lobes involving the cortex and subcortical white matter as well as the centrum semiovale.  He had a CTA of the head and neck performed with no emergent findings of severe stenosis.  He is being admitted for acute stroke workup and management.

## 2023-09-01 NOTE — ED Notes (Signed)
 Swallow screen not preformed at this time due to awaiting CT Scan.

## 2023-09-01 NOTE — ED Provider Notes (Signed)
 [facility] AT [facility] Provider Note   CSN: [id number] Arrival date & time: 09/01/23  1324     History  Chief Complaint  Patient presents with   Code Stroke    Cory Terry is a 58 y.o. male.  He has no significant past medical history.  He is presenting complaining of numbness in his right face and arm that started at 1030 last night.  He said when it happened he also had some slurred speech and some difficulty using his right hand.  He said symptoms are little bit better this morning but not fully resolved.  No blurry vision double vision.  No difficulty with ambulation.  No prior history of same.  The history is provided by the patient.  Cerebrovascular Accident This is a new problem. The current episode started 6 to 12 hours ago. The problem has been gradually improving. Pertinent negatives include no chest pain, no abdominal pain, no headaches and no shortness of breath. Nothing aggravates the symptoms. Nothing relieves the symptoms. He has tried rest for the symptoms. The treatment provided mild relief.       Home Medications Prior to Admission medications   Medication Sig Start Date End Date Taking? Authorizing Provider  famotidine (PEPCID) 40 MG tablet Take 1 tablet (40 mg total) by mouth daily. 06/11/23 07/11/23  Anders Simmonds T, DO  ibuprofen (ADVIL) 200 MG tablet Take 2 tablets (400 mg total) by mouth every 6 (six) hours as needed. 06/11/23   Arletha Pili, DO  oxyCODONE-acetaminophen (PERCOCET) 10-325 MG tablet Take 1 tablet by mouth every 4 (four) hours as needed for pain. 04/21/22   [provider]  tamsulosin (FLOMAX) 0.4 MG CAPS capsule Take 0.4 mg by mouth daily. 12/28/21   [provider]      Allergies    Bee venom    Review of Systems   Review of Systems  Constitutional:  Negative for fever.  HENT:  Negative for sore throat.   Eyes:  Negative for visual disturbance.  Respiratory:  Negative for  shortness of breath.   Cardiovascular:  Negative for chest pain.  Gastrointestinal:  Negative for abdominal pain.  Genitourinary:  Negative for dysuria.  Skin:  Negative for rash.  Neurological:  Positive for speech difficulty, weakness and numbness. Negative for headaches.    Physical Exam Updated Vital Signs BP (!) 165/101   Pulse 71   Resp 18   Ht 6\' 2"  (1.88 m)   Wt 79.8 kg   SpO2 99%   BMI 22.60 kg/m  Physical Exam Vitals and nursing note reviewed.  Constitutional:      General: He is not in acute distress.    Appearance: Normal appearance. He is well-developed.  HENT:     Head: Normocephalic and atraumatic.  Eyes:     Conjunctiva/sclera: Conjunctivae normal.  Cardiovascular:     Rate and Rhythm: Normal rate and regular rhythm.     Heart sounds: No murmur heard. Pulmonary:     Effort: Pulmonary effort is normal. No respiratory distress.     Breath sounds: Normal breath sounds.  Abdominal:     Palpations: Abdomen is soft.     Tenderness: There is no abdominal tenderness. There is no guarding or rebound.  Musculoskeletal:        General: No deformity. Normal range of motion.     Cervical back: Neck supple.  Skin:    General: Skin is warm and dry.  Capillary Refill: Capillary refill takes less than 2 seconds.  Neurological:     Mental Status: He is alert.     Cranial Nerves: No cranial nerve deficit.     Sensory: Sensory deficit (Diminished sensation right face and arm) present.     Motor: No weakness.     ED Results / Procedures / Treatments   Labs (all labs ordered are listed, but only abnormal results are displayed) Labs Reviewed  RAPID URINE DRUG SCREEN, HOSP PERFORMED - Abnormal; Notable for the following components:      Result Value   Cocaine POSITIVE (*)    Tetrahydrocannabinol POSITIVE (*)    All other components within normal limits  CBG MONITORING, ED - Abnormal; Notable for the following components:   Glucose-Capillary 112 (*)    All other  components within normal limits  ETHANOL  PROTIME-INR  APTT  CBC  DIFFERENTIAL  COMPREHENSIVE METABOLIC PANEL WITH GFR  URINALYSIS, ROUTINE W REFLEX MICROSCOPIC  HIV ANTIBODY (ROUTINE TESTING W REFLEX)  HEMOGLOBIN A1C  LIPID PANEL    EKG EKG Interpretation Date/Time:  Thursday September 01 2023 08:52:05 EDT Ventricular Rate:  63 PR Interval:  150 QRS Duration:  95 QT Interval:  384 QTC Calculation: 393 R Axis:   57  Text Interpretation: Sinus rhythm No old tracing to compare Confirmed by Meridee Score [id number]) on 09/01/2023 9:00:00 AM  Radiology MR BRAIN WO CONTRAST Result Date: 09/01/2023 CLINICAL DATA:  Neuro deficit, concern for stroke. Slurred speech, right-sided face and arm numbness. EXAM: MRI HEAD WITHOUT CONTRAST TECHNIQUE: Multiplanar, multiecho pulse sequences of the brain and surrounding structures were obtained without intravenous contrast. COMPARISON:  Same day CTA head and neck. FINDINGS: Brain: There are a few scattered small foci of acute infarct in the posterior left frontal lobe involving the cortex and subcortical white matter as well as the centrum semiovale. There are small infarcts noted involving the cortex along the lateral aspect of the left pre and postcentral gyri. No evidence of intracranial hemorrhage. Scattered FLAIR signal abnormality in the white matter particularly within the bifrontal subcortical white matter. Small remote infarct in the right cerebellum. No mass lesion or midline shift. Normal appearance of midline structures. The basilar cisterns are patent. No extra-axial fluid collections. Ventricles: Normal size and configuration of the ventricles. Vascular: Skull base flow voids are visualized. Skull and upper cervical spine: No focal abnormality. Sinuses/Orbits: Orbits are symmetric. Mild mucosal thickening in the right anterior ethmoid air cells. Other: Mastoid air cells are clear. 1.1 cm slightly T1 hyperintense focus within the nasopharynx near  midline with hypointense appearance on T2 and FLAIR images. IMPRESSION: Small scattered foci of acute infarct in the left frontoparietal lobes involving the cortex and subcortical white matter as well as the centrum semiovale. Involvement of the lateral aspect of the left precentral and postcentral gyri. No significant mass effect or midline shift. Nonspecific white matter signal abnormality likely reflecting mild chronic microvascular ischemic changes. Small remote infarct in the right cerebellum. 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 Consider nonemergent correlation with direct visualization. Electronically Signed   By: Emily Filbert M.D.   On: 09/01/2023 14:38   CT ANGIO HEAD NECK W WO CM Result Date: 09/01/2023 CLINICAL DATA:  Slurred speech and right-sided face/arm loss of feeling/numbness. EXAM: CT ANGIOGRAPHY HEAD AND NECK WITH AND WITHOUT CONTRAST TECHNIQUE: Multidetector CT imaging of the head and neck was performed using the standard protocol during bolus administration  of intravenous contrast. Multiplanar CT image reconstructions and MIPs were obtained to evaluate the vascular anatomy. Carotid stenosis measurements (when applicable) are obtained utilizing NASCET criteria, using the distal internal carotid diameter as the denominator. RADIATION DOSE REDUCTION: This exam was performed according to the departmental dose-optimization program which includes automated exposure control, adjustment of the mA and/or kV according to patient size and/or use of iterative reconstruction technique. CONTRAST:  75mL OMNIPAQUE IOHEXOL 350 MG/ML SOLN COMPARISON:  None Available. FINDINGS: CT HEAD FINDINGS Brain: No evidence of acute infarction, hemorrhage, hydrocephalus, extra-axial collection or mass lesion/mass effect. No visible white matter disease. Vascular: No hyperdense vessel or unexpected calcification. Skull: Normal.  Negative for fracture or focal lesion. Sinuses/Orbits: No acute finding. Review of the MIP images confirms the above findings CTA NECK FINDINGS Aortic arch: Normal with 4 vessel branching Right carotid system: Vessels are smoothly contoured and diffusely patent. Left carotid system: Vessels are smoothly contoured and diffusely patent. No significant atheromatous change Vertebral arteries: The vertebral and proximal subclavian arteries are smoothly contoured and widely patent. Mild dominance of the right vertebral artery. Skeleton: No acute or aggressive finding Other neck: No acute finding Upper chest: Clear apical lungs Review of the MIP images confirms the above findings CTA HEAD FINDINGS Anterior circulation: Atheromatous calcification of the cavernous carotids. No branch occlusion, beading, or proximal flow reducing stenosis. No evidence of aneurysm. Arterial detail is diminished by bolus timing which is preferentially venous. Posterior circulation: The vertebral and basilar arteries are smoothly contoured and diffusely patent. No branch occlusion, beading, or aneurysm. Venous sinuses: Diffusely patent Anatomic variants: None significant Review of the MIP images confirms the above findings IMPRESSION: No emergent finding or explanation for symptoms. Mild atherosclerosis without irregularity or significant stenosis of major arteries in the head and neck. Electronically Signed   By: Tiburcio Pea M.D.   On: 09/01/2023 10:38    Procedures Procedures    Medications Ordered in ED Medications  oxyCODONE (Oxy IR/ROXICODONE) immediate release tablet 15 mg (has no administration in time range)  tamsulosin (FLOMAX) capsule 0.4 mg (has no administration in time range)   stroke: early stages of recovery book (has no administration in time range)  0.9 %  sodium chloride infusion ( Intravenous New Bag/Given 09/01/23 1624)  acetaminophen (TYLENOL) tablet 650 mg (has no administration in time range)    Or   acetaminophen (TYLENOL) 160 MG/5ML solution 650 mg (has no administration in time range)    Or  acetaminophen (TYLENOL) suppository 650 mg (has no administration in time range)  senna-docusate (Senokot-S) tablet 1 tablet (has no administration in time range)  enoxaparin (LOVENOX) injection 40 mg (has no administration in time range)  pantoprazole (PROTONIX) EC tablet 40 mg (has no administration in time range)  aspirin EC tablet 81 mg (has no administration in time range)  clopidogrel (PLAVIX) tablet 75 mg (has no administration in time range)  atorvastatin (LIPITOR) tablet 40 mg (has no administration in time range)  iohexol (OMNIPAQUE) 350 MG/ML injection 75 mL (75 mLs Intravenous Contrast Given 09/01/23 1008)  clopidogrel (PLAVIX) tablet 300 mg (300 mg Oral Given 09/01/23 1632)    ED Course/ Medical Decision Making/ A&P Clinical Course as of 09/01/23 1730  Thu Sep 01, 2023  0913 Reviewed case with neuro nausea Dr. Selina Cooley.  She does not feel activation is warranted and can continue with routine workup. [MB]  1109 CTA head and neck does not show any acute findings.  Have put in for MRI. [MB]  1448  Reviewed case with Dr. Selina Cooley neurology.  She said admitted to [facility] and see if Dr. Melynda Ripple can consult on him today. [MB]  1504 Discussed with Dr. Laural Benes [facility] hospitalist who will evaluate patient for admission [MB]    Clinical Course User Index [MB] Terrilee Files, MD                                 Medical Decision Making Amount and/or Complexity of Data Reviewed Labs: ordered. Radiology: ordered.  Risk Prescription drug management. Decision regarding hospitalization.   This patient complains of numbness and weakness speech difficulty on right side; this involves an extensive number of treatment Options and is a complaint that carries with it a high risk of complications and morbidity. The differential includes stroke, bleed, TIA, seizure  I ordered, reviewed and  interpreted labs, which included CBC normal chemistries normal talk screen positive for cocaine and THC I ordered imaging studies which included CT angio head and neck, MRI brain and I independently    visualized and interpreted imaging which showed acute stroke, also has nasopharyngeal lesion that will need follow-up Previous records obtained and reviewed in epic no recent admissions I consulted neurology Dr. Selina Cooley and [facility] hospitalist Dr. Laural Benes and discussed lab and imaging findings and discussed disposition.  Cardiac monitoring reviewed, sinus rhythm Social determinants considered, no significant barriers Critical Interventions: None  After the interventions stated above, I reevaluated the patient and found patient's neurosymptoms to be mild Admission and further testing considered, he would benefit from mission to the hospital for further evaluation by neurology.  Patient in agreement plan for admission.         Final Clinical Impression(s) / ED Diagnoses Final diagnoses:  Acute CVA (cerebrovascular accident) [facility])    Rx / DC Orders ED Discharge Orders     None         Terrilee Files, MD 09/01/23 1734

## 2023-09-02 ENCOUNTER — Other Ambulatory Visit: Payer: Self-pay | Admitting: Family Medicine

## 2023-09-02 ENCOUNTER — Telehealth: Payer: Self-pay | Admitting: *Deleted

## 2023-09-02 ENCOUNTER — Observation Stay (HOSPITAL_COMMUNITY): Payer: Self-pay

## 2023-09-02 DIAGNOSIS — I6389 Other cerebral infarction: Secondary | ICD-10-CM

## 2023-09-02 DIAGNOSIS — I639 Cerebral infarction, unspecified: Secondary | ICD-10-CM

## 2023-09-02 DIAGNOSIS — J392 Other diseases of pharynx: Secondary | ICD-10-CM

## 2023-09-02 LAB — HEMOGLOBIN A1C
Hgb A1c MFr Bld: 5.3 % (ref 4.8–5.6)
Mean Plasma Glucose: 105 mg/dL

## 2023-09-02 LAB — ECHOCARDIOGRAM COMPLETE
AR max vel: 3.03 cm2
AV Area VTI: 2.86 cm2
AV Area mean vel: 2.51 cm2
AV Mean grad: 4 mmHg
AV Peak grad: 6.6 mmHg
Ao pk vel: 1.28 m/s
Area-P 1/2: 3.3 cm2
Calc EF: 63.3 %
Height: 74 in
MV VTI: 3.35 cm2
S' Lateral: 3.1 cm
Single Plane A2C EF: 57.1 %
Single Plane A4C EF: 65.1 %
Weight: 2816 [oz_av]

## 2023-09-02 LAB — LIPID PANEL
Cholesterol: 169 mg/dL (ref 0–200)
HDL: 70 mg/dL (ref >40)
LDL Cholesterol: 65 mg/dL (ref 0–99)
Total CHOL/HDL Ratio: 2.4 ratio
Triglycerides: 171 mg/dL — ABNORMAL HIGH (ref <150)
VLDL: 34 mg/dL (ref 0–40)

## 2023-09-02 LAB — HIV ANTIBODY (ROUTINE TESTING W REFLEX): HIV Screen 4th Generation wRfx: NONREACTIVE

## 2023-09-02 MED ORDER — TAMSULOSIN HCL 0.4 MG PO CAPS: 0.4000 mg | ORAL_CAPSULE | Freq: Every day | ORAL | 1 refills | Status: AC

## 2023-09-02 MED ORDER — CLOPIDOGREL BISULFATE 75 MG PO TABS: 75.0000 mg | ORAL_TABLET | Freq: Every day | ORAL | 0 refills | Status: AC

## 2023-09-02 MED ORDER — AMLODIPINE BESYLATE 5 MG PO TABS: 5.0000 mg | ORAL_TABLET | Freq: Every day | ORAL | 2 refills | Status: AC

## 2023-09-02 MED ORDER — ASPIRIN 81 MG PO TBEC: 81.0000 mg | DELAYED_RELEASE_TABLET | Freq: Every day | ORAL | 12 refills | Status: AC

## 2023-09-02 NOTE — Discharge Instructions (Signed)
 Take aspirin 81 mg and plavix 75 mg for 3 weeks, then stop plavix, then take aspirin 81 mg daily afterwards   IMPORTANT INFORMATION: PAY CLOSE ATTENTION   PHYSICIAN DISCHARGE INSTRUCTIONS  Follow with Primary care provider  Elfredia Nevins, MD  and other consultants as instructed by your Hospitalist Physician  SEEK MEDICAL CARE OR RETURN TO EMERGENCY ROOM IF SYMPTOMS COME BACK, WORSEN OR NEW PROBLEM DEVELOPS   Please note: You were cared for by a hospitalist during your hospital stay. Every effort will be made to forward records to your primary care provider.  You can request that your primary care provider send for your hospital records if they have not received them.  Once you are discharged, your primary care physician will handle any further medical issues. Please note that NO REFILLS for any discharge medications will be authorized once you are discharged, as it is imperative that you return to your primary care physician (or establish a relationship with a primary care physician if you do not have one) for your post hospital discharge needs so that they can reassess your need for medications and monitor your lab values.  Please get a complete blood count and chemistry panel checked by your Primary MD at your next visit, and again as instructed by your Primary MD.  Get Medicines reviewed and adjusted: Please take all your medications with you for your next visit with your Primary MD  Laboratory/radiological data: Please request your Primary MD to go over all hospital tests and procedure/radiological results at the follow up, please ask your primary care provider to get all Hospital records sent to his/her office.  In some cases, they will be blood work, cultures and biopsy results pending at the time of your discharge. Please request that your primary care provider follow up on these results.  If you are diabetic, please bring your blood sugar readings with you to your follow up  appointment with primary care.    Please call and make your follow up appointments as soon as possible.    Also Note the following: If you experience worsening of your admission symptoms, develop shortness of breath, life threatening emergency, suicidal or homicidal thoughts you must seek medical attention immediately by calling 911 or calling your MD immediately  if symptoms less severe.  You must read complete instructions/literature along with all the possible adverse reactions/side effects for all the Medicines you take and that have been prescribed to you. Take any new Medicines after you have completely understood and accpet all the possible adverse reactions/side effects.   Do not drive when taking Pain medications or sleeping medications (Benzodiazepines)  Do not take more than prescribed Pain, Sleep and Anxiety Medications. It is not advisable to combine anxiety,sleep and pain medications without talking with your primary care practitioner  Special Instructions: If you have smoked or chewed Tobacco  in the last 2 yrs please stop smoking, stop any regular Alcohol  and or any Recreational drug use.  Wear Seat belts while driving.  Do not drive if taking any narcotic, mind altering or controlled substances or recreational drugs or alcohol.

## 2023-09-02 NOTE — Consult Note (Signed)
 I connected with  Cory Terry on 09/02/23 by a video enabled telemedicine application and verified that I am speaking with the correct person using two identifiers.   I discussed the limitations of evaluation and management by telemedicine. The patient expressed understanding and agreed to proceed.  Location of patient: [facility] Location of physician: [facility]  Neurology Consultation Reason for Consult: Stroke Referring Physician: Dr. Standley Dakins  CC: Right-sided weakness numbness, slurred speech  History is obtained from: Patient, chart review  HPI: Cory Terry is a 58 y.o. male with no significant past medical history who presented with numbness and weakness on  the right side of his face and arm as well as slurred speech.  States he went to bed around 9 PM on/01/2024.  He woke up around 10:30 pm and noticed that right side of his face and arm feel numb and weak and his speech is slurred.  He had a primary care appointment next day so decided to wait till then.  When he went to see his PCP his PCP told him to come to emergency room immediately.  Last known normal: 4/9/20255 2100 Event happened at home No tPA as outside window No thrombectomy as no large vessel occlusion mRS 0   ROS: All other systems reviewed and negative except as noted in the HPI.   Past Medical History:  Diagnosis Date   Back pain    Back pain    BPH (benign prostatic hyperplasia)    GERD (gastroesophageal reflux disease)    Shingles 2011    History reviewed. No pertinent family history.   Social History:  reports that he has never smoked. He has never used smokeless tobacco. He reports current alcohol use. He reports that he does not use drugs.   Medications Prior to Admission  Medication Sig Dispense Refill Last Dose/Taking   ibuprofen (ADVIL) 200 MG tablet Take 2 tablets (400 mg total) by mouth every 6 (six) hours as needed. (Patient taking differently: Take 600 mg by  mouth every 6 (six) hours as needed for mild pain (pain score 1-3).) 60 tablet 2 08/31/2023 Morning   oxyCODONE (ROXICODONE) 15 MG immediate release tablet Take 15 mg by mouth 4 (four) times daily as needed for pain.   Past Week   tamsulosin (FLOMAX) 0.4 MG CAPS capsule Take 0.4 mg by mouth daily.   Past Week      Exam: Current vital signs: BP (!) 155/105 (BP Location: Right Arm)   Pulse 91   Temp 99.4 F (37.4 C) (Oral)   Resp 18   Ht 6\' 2"  (1.88 m)   Wt 79.8 kg   SpO2 96%   BMI 22.60 kg/m  Vital signs in last 24 hours: Temp:  [98 F (36.7 C)-99.7 F (37.6 C)] 99.4 F (37.4 C) (04/11 0410) Pulse Rate:  [63-105] 91 (04/11 0410) Resp:  [14-20] 18 (04/11 0410) BP: (130-165)/(91-118) 155/105 (04/11 0410) SpO2:  [95 %-100 %] 96 % (04/11 0410) Weight:  [79.8 kg] 79.8 kg (04/10 0848)   Physical Exam  Constitutional: Appears well-developed and well-nourished.  Psych: Affect appropriate to situation Neuro: AO x 3, no aphasia, dysarthria cranial nerves grossly intact, antigravity without drift in all 4 extremities, sensation intact to light touch, FTN intact bilaterally  NIHSS 0   I have reviewed labs in epic and the results pertinent to this consultation are: CBC:  Recent Labs  Lab 09/01/23 0848  WBC 8.7  NEUTROABS 6.8  HGB 15.7  HCT 47.7  MCV 97.5  PLT 284    Basic Metabolic Panel:  Lab Results  Component Value Date   NA 138 09/01/2023   K 3.8 09/01/2023   CO2 24 09/01/2023   GLUCOSE 96 09/01/2023   BUN 12 09/01/2023   CREATININE 0.90 09/01/2023   CALCIUM 9.6 09/01/2023   GFRNONAA >60 09/01/2023   GFRAA 80 (L) 05/17/2012   Lipid Panel:  Lab Results  Component Value Date   LDLCALC 65 09/02/2023   HgbA1c:  Lab Results  Component Value Date   HGBA1C 5.3 09/01/2023   Urine Drug Screen:     Component Value Date/Time   LABOPIA NONE DETECTED 09/01/2023 1512   COCAINSCRNUR POSITIVE (A) 09/01/2023 1512   LABBENZ NONE DETECTED 09/01/2023 1512   AMPHETMU  NONE DETECTED 09/01/2023 1512   THCU POSITIVE (A) 09/01/2023 1512   LABBARB NONE DETECTED 09/01/2023 1512    Alcohol Level     Component Value Date/Time   ETH <10 09/01/2023 0848     I have reviewed the images obtained:   CT Head without contrast 09/01/2023: No evidence of acute infarction, hemorrhage, hydrocephalus, extra-axial collection or mass lesion/mass effect. No visible white matter disease.  CT angio Head and Neck with contrast 09/01/2023: No emergent finding or explanation for symptoms. Mild atherosclerosis without irregularity or significant stenosis of major arteries in the head and neck.  MRI Brain without contrast/02/2024: Small scattered foci of acute infarct in the left frontoparietal lobes involving the cortex and subcortical white matter as well as the centrum semiovale. Involvement of the lateral aspect of the left precentral and postcentral gyri. No significant mass effect or midline shift.  Nonspecific white matter signal abnormality likely reflecting mild chronic microvascular ischemic changes. Small remote infarct in the right cerebellum.      ASSESSMENT/PLAN: 58 year old male presented with sudden onset right-sided weakness, numbness and slurred speech.  MRI brain showed acute stroke.  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  If negative, recommend cardiac monitor to look for paroxysmal A-fib - Counseled against cocaine use and cannabis use - PT/OT - Stroke education - Goal blood pressure: Normotension - Follow-up with neurology in 3 months (order placed) - Discussed plan with Dr. Laural Benes via secure chat   Thank you for allowing [place] to participate in the care of this patient. If you have any further questions,  please contact  me or neurohospitalist.   Lindie Spruce Epilepsy [facility] neurohospitalist

## 2023-09-02 NOTE — TOC Initial Note (Signed)
 Transition of Care [facility]) - Initial/Assessment Note    Patient Details  Name: Cory Terry MRN: [id number] Date of Birth: 04-22-1966  Transition of [facility]) CM/SW Contact:    Villa Herb, LCSWA Phone Number: 09/02/2023, 10:26 AM  Clinical Narrative:                 CSW noted pt has no insurance listed in chart. CSW met with pts significant other outside of pts room as pt was getting ECHO to speak about this. Pt has PCP listed in chart. Pts SO feels that pt does follow this PCP and will speak with pt about getting insurance added to chart if he has it. CSW also provided free clinic info for pt. TOC to follow.   Expected Discharge Plan: Home/Self Care Barriers to Discharge: Continued Medical Work up   Patient Goals and CMS Choice Patient states their goals for this hospitalization and ongoing recovery are:: return home CMS Medicare.gov Compare Post Acute Care list provided to:: Patient Choice offered to / list presented to : Patient      Expected Discharge Plan and Services In-house Referral: Clinical Social Work Discharge Planning Services: CM Consult   Living arrangements for the past 2 months: Single Family Home                                      Prior Living Arrangements/Services Living arrangements for the past 2 months: Single Family Home Lives with:: Self, Significant Other Patient language and need for interpreter reviewed:: Yes Do you feel safe going back to the place where you live?: Yes      Need for Family Participation in Patient Care: Yes (Comment) Care giver support system in place?: Yes (comment)   Criminal Activity/Legal Involvement Pertinent to Current Situation/Hospitalization: No - Comment as needed  Activities of Daily Living   ADL Screening (condition at time of admission) Independently performs ADLs?: Yes (appropriate for developmental age) Is the patient deaf or have difficulty hearing?: No Does the patient have difficulty  seeing, even when wearing glasses/contacts?: No Does the patient have difficulty concentrating, remembering, or making decisions?: No  Permission Sought/Granted                  Emotional Assessment Appearance:: Appears stated age Attitude/Demeanor/Rapport: Engaged Affect (typically observed): Accepting   Alcohol / Substance Use: Not Applicable Psych Involvement: No (comment)  Admission diagnosis:  Acute CVA (cerebrovascular accident) [facility]) [I63.9] Patient Active Problem List   Diagnosis Date Noted   Acute CVA (cerebrovascular accident) (HCC) 09/01/2023   BPH (benign prostatic hyperplasia) 09/01/2023   GERD (gastroesophageal reflux disease) 09/01/2023   Chronic back pain 09/01/2023   PCP:  Elfredia Nevins, MD Pharmacy:   [facility] - [place], [place] - [address] [place] [address] Phone: [phone] Fax: [phone]  [id number] - [place], [place] - [address] AT [facility] [address] [place] [address] Phone: [phone] Fax: [phone]     Social Drivers of Health (SDOH) Social History: SDOH Screenings   Food Insecurity: No Food Insecurity (09/01/2023)  Housing: Low Risk  (09/01/2023)  Transportation Needs: No Transportation Needs (09/01/2023)  Utilities: Not At Risk (09/01/2023)  Social Connections: Moderately Isolated (09/01/2023)  Tobacco Use: Low Risk  (09/01/2023)   SDOH Interventions:     Readmission Risk Interventions     No data to display

## 2023-09-02 NOTE — Progress Notes (Signed)
 SLP Cancellation Note  Patient Details Name: Cory Terry MRN: [id number] DOB: 1966-01-23   Cancelled treatment:       Reason Eval/Treat Not Completed: SLP screened, no needs identified, will sign off. Slurred speech has resolved and cognition and language are functioning at baseline. Thank you for this referral,  Danaja Lasota H. Romie Levee, CCC-SLP Speech Language Pathologist   Georgetta Haber 09/02/2023, 9:42 AM

## 2023-09-02 NOTE — Telephone Encounter (Signed)
Order placed and pt enrolled in [employer].

## 2023-09-02 NOTE — Discharge Summary (Addendum)
 Physician Discharge Summary  Cory Terry:[id number] DOB: 05/22/1966 DOA: 09/01/2023  PCP: Elfredia Nevins, MD  Admit date: 09/01/2023 Discharge date: 09/02/2023  Admitted From:  Home  Disposition: Home   Recommendations for Outpatient Follow-up:  Follow up with PCP in 1 weeks Follow up with [facility] in 3 months 30 day cardiac monitor to be mailed to home  Discharge Condition: STABLE   CODE STATUS: FULL DIET: Low sodium heart healthy   Brief Hospitalization Summary: Please see all hospital notes, images, labs for full details of the hospitalization. 58 year old male with history of chronic back pain and chronic left hip pain (says he needs a left hip replacement), GERD, BPH, marijuana user, reports he started having numbness on the right side of his face and arm that started yesterday evening around 10:30 PM.  He was also having difficulty with his slurred speech and difficulty using his right hand. He did not have symptoms in the right lower extremity.  He said that he has been under a lof of stress with his job and he had eaten a burger and taco yesterday with a lot of sodium content.  He decided to come to the emergency department because his symptoms had not resolved but did report that they were little bit more improved.  He was evaluated in the ED today with findings of small scattered foci of acute infarct in the left frontoparietal lobes involving the cortex and subcortical white matter as well as the centrum semiovale.  He had a CTA of the head and neck performed with no emergent findings of severe stenosis.  He is being admitted for acute stroke workup and management.   Hospital Course Pt was admitted with MRI positive acute CVA.  He was placed on supportive measures and full stroke workup completed in the hospital.  It has been reassuring.  His LDL is 65 and statin was not recommended.  He was seen by the neurologist who recommended that he take aspirin 81 mg and  Plavix 75 mg daily for 3 weeks followed by aspirin 81 mg.  He had a 2D echocardiogram with normal LVEF and grade 1 diastolic dysfunction.  Neurology recommended that he have a cardiac monitor arranged to rule out paroxysmal atrial fibrillation.  I have sent a request to the [facility] heart care office and requested a 30-day cardiac monitor be mailed to patient's home.  He was started on amlodipine for elevated blood pressures.  He tested positive for cocaine and marijuana and was counseled on stopping all recreational drug use.  He verbalized understanding.  He denied tobacco use.  He now feels back to his baseline and he is being discharged home in stable condition.  Neurology cleared him to discharge on 09/02/2023.  Neurology arrange for 21-month outpatient follow-up with [facility] neurology.  Discharge Diagnoses:  Principal Problem:   Acute CVA (cerebrovascular accident) (HCC) Active Problems:   BPH (benign prostatic hyperplasia)   GERD (gastroesophageal reflux disease)   Chronic back pain   Discharge Instructions: Discharge Instructions     Ambulatory referral to Neurology   Complete by: As directed    An appointment is requested in approximately: 8-12 weeks      Allergies as of 09/02/2023       Reactions   Bee Venom Swelling        Medication List     TAKE these medications    amLODipine 5 MG tablet Commonly known as: NORVASC Take 1 tablet (5 mg total) by mouth  daily.   aspirin EC 81 MG tablet Take 1 tablet (81 mg total) by mouth daily. Swallow whole. Start taking on: September 03, 2023   clopidogrel 75 MG tablet Commonly known as: PLAVIX Take 1 tablet (75 mg total) by mouth daily for 20 days. Stop taking after 20 days Start taking on: September 03, 2023   ibuprofen 200 MG tablet Commonly known as: Advil Take 2 tablets (400 mg total) by mouth every 6 (six) hours as needed. What changed:  how much to take reasons to take this   oxyCODONE 15 MG immediate release  tablet Commonly known as: ROXICODONE Take 15 mg by mouth 4 (four) times daily as needed for pain.   tamsulosin 0.4 MG Caps capsule Commonly known as: FLOMAX Take 1 capsule (0.4 mg total) by mouth daily.        Follow-up Information     Kathyleen Parkins, MD. Schedule an appointment as soon as possible for a visit in 1 week(s).   Specialty: Internal Medicine Why: Hospital Follow Up Contact information: [address] [place] [address] [phone]         [facility]. Schedule an appointment as soon as possible for a visit in 3 month(s).   Specialty: Neurology Why: Hospital Follow Up Contact information: [address] [place]  [address] [phone]               Allergies  Allergen Reactions   Bee Venom Swelling   Allergies as of 09/02/2023       Reactions   Bee Venom Swelling        Medication List     TAKE these medications    amLODipine 5 MG tablet Commonly known as: NORVASC Take 1 tablet (5 mg total) by mouth daily.   aspirin EC 81 MG tablet Take 1 tablet (81 mg total) by mouth daily. Swallow whole. Start taking on: September 03, 2023   clopidogrel 75 MG tablet Commonly known as: PLAVIX Take 1 tablet (75 mg total) by mouth daily for 20 days. Stop taking after 20 days Start taking on: September 03, 2023   ibuprofen 200 MG tablet Commonly known as: Advil Take 2 tablets (400 mg total) by mouth every 6 (six) hours as needed. What changed:  how much to take reasons to take this   oxyCODONE 15 MG immediate release tablet Commonly known as: ROXICODONE Take 15 mg by mouth 4 (four) times daily as needed for pain.   tamsulosin 0.4 MG Caps capsule Commonly known as: FLOMAX Take 1 capsule (0.4 mg total) by mouth daily.        Procedures/Studies: ECHOCARDIOGRAM COMPLETE Result Date: 09/02/2023    ECHOCARDIOGRAM REPORT   Patient Name:   JRU PENSE Date of Exam: 09/02/2023  Medical Rec #:  [id number]      Height:       74.0 in Accession #:    [id number]     Weight:       176.0 lb Date of Birth:  [birth year]     BSA:          2.059 m Patient Age:    57 years       BP:           124/84 mmHg Patient Gender: M              HR:           69 bpm. Exam Location:  [facility] Procedure: 2D Echo,  Cardiac Doppler, Color Doppler and Strain Analysis (Both            Spectral and Color Flow Doppler were utilized during procedure). Indications:    Stroke I63.9  History:        Patient has no prior history of Echocardiogram examinations.                 Stroke; Risk Factors:Hypertension and Non-Smoker.  Sonographer:    Reed Canes RCS, RVS Referring Phys: [phone] Mateya Torti L Sonnie Bias IMPRESSIONS  1. Left ventricular ejection fraction, by estimation, is 60 to 65%. The left ventricle has normal function. The left ventricle has no regional wall motion abnormalities. Left ventricular diastolic parameters are consistent with Grade I diastolic dysfunction (impaired relaxation). The average left ventricular global longitudinal strain is -20.5 %. The global longitudinal strain is normal.  2. Right ventricular systolic function is normal. The right ventricular size is normal. Tricuspid regurgitation signal is inadequate for assessing PA pressure.  3. The mitral valve is grossly normal. Trivial mitral valve regurgitation.  4. The aortic valve is tricuspid. Aortic valve regurgitation is not visualized. No aortic stenosis is present. Aortic valve mean gradient measures 4.0 mmHg.  5. The inferior vena cava is normal in size with greater than 50% respiratory variability, suggesting right atrial pressure of 3 mmHg. Comparison(s): No prior Echocardiogram. FINDINGS  Left Ventricle: Left ventricular ejection fraction, by estimation, is 60 to 65%. The left ventricle has normal function. The left ventricle has no regional wall motion abnormalities. The average left ventricular global longitudinal strain is -20.5 %. Strain  was performed and the global longitudinal strain is normal. The left ventricular internal cavity size was normal in size. There is no left ventricular hypertrophy. Left ventricular diastolic parameters are consistent with Grade I diastolic dysfunction (impaired relaxation). Right Ventricle: The right ventricular size is normal. No increase in right ventricular wall thickness. Right ventricular systolic function is normal. Tricuspid regurgitation signal is inadequate for assessing PA pressure. Left Atrium: Left atrial size was normal in size. Right Atrium: Right atrial size was normal in size. Pericardium: There is no evidence of pericardial effusion. Mitral Valve: The mitral valve is grossly normal. Trivial mitral valve regurgitation. MV peak gradient, 2.8 mmHg. The mean mitral valve gradient is 1.0 mmHg. Tricuspid Valve: The tricuspid valve is grossly normal. Tricuspid valve regurgitation is trivial. Aortic Valve: The aortic valve is tricuspid. Aortic valve regurgitation is not visualized. No aortic stenosis is present. Aortic valve mean gradient measures 4.0 mmHg. Aortic valve peak gradient measures 6.6 mmHg. Aortic valve area, by VTI measures 2.86 cm. Pulmonic Valve: The pulmonic valve was not well visualized. Pulmonic valve regurgitation is trivial. Aorta: The aortic root and ascending aorta are structurally normal, with no evidence of dilitation. Venous: The inferior vena cava is normal in size with greater than 50% respiratory variability, suggesting right atrial pressure of 3 mmHg. IAS/Shunts: No atrial level shunt detected by color flow Doppler. Additional Comments: 3D was performed not requiring image post processing on an independent workstation and was indeterminate.  LEFT VENTRICLE PLAX 2D LVIDd:         5.00 cm     Diastology LVIDs:         3.10 cm     LV e' lateral:   10.00 cm/s LV PW:         0.90 cm     LV E/e' lateral: 6.6 LV IVS:        0.90 cm LVOT diam:  2.10 cm     2D Longitudinal Strain LV  SV:         64          2D Strain GLS (A4C):   -20.0 % LV SV Index:   31          2D Strain GLS (A3C):   -21.6 % LVOT Area:     3.46 cm    2D Strain GLS (A2C):   -19.9 %                            2D Strain GLS Avg:     -20.5 %  LV Volumes (MOD) LV vol d, MOD A2C: 59.4 ml LV vol d, MOD A4C: 65.6 ml LV vol s, MOD A2C: 25.5 ml LV vol s, MOD A4C: 22.9 ml LV SV MOD A2C:     33.9 ml LV SV MOD A4C:     65.6 ml LV SV MOD BP:      42.1 ml RIGHT VENTRICLE            IVC RV Basal diam:  3.30 cm    IVC diam: 1.30 cm RV Mid diam:    2.80 cm RV S prime:     9.57 cm/s TAPSE (M-mode): 2.1 cm LEFT ATRIUM             Index        RIGHT ATRIUM           Index LA diam:        3.00 cm 1.46 cm/m   RA Area:     14.00 cm LA Vol (A2C):   41.8 ml 20.31 ml/m  RA Volume:   33.90 ml  16.47 ml/m LA Vol (A4C):   35.8 ml 17.39 ml/m LA Biplane Vol: 39.8 ml 19.33 ml/m  AORTIC VALVE                    PULMONIC VALVE AV Area (Vmax):    3.03 cm     PV Vmax:       1.58 m/s AV Area (Vmean):   2.51 cm     PV Peak grad:  10.0 mmHg AV Area (VTI):     2.86 cm AV Vmax:           128.00 cm/s AV Vmean:          90.200 cm/s AV VTI:            0.225 m AV Peak Grad:      6.6 mmHg AV Mean Grad:      4.0 mmHg LVOT Vmax:         112.00 cm/s LVOT Vmean:        65.300 cm/s LVOT VTI:          0.186 m LVOT/AV VTI ratio: 0.83  AORTA Ao Root diam: 3.20 cm Ao Asc diam:  3.10 cm MITRAL VALVE MV Area (PHT): 3.30 cm    SHUNTS MV Area VTI:   3.35 cm    Systemic VTI:  0.19 m MV Peak grad:  2.8 mmHg    Systemic Diam: 2.10 cm MV Mean grad:  1.0 mmHg MV Vmax:       0.83 m/s MV Vmean:      52.1 cm/s MV Decel Time: 230 msec MV E velocity: 65.60 cm/s MV A velocity: 66.00 cm/s MV E/A ratio:  0.99 Teddie Favre MD Electronically signed by Samuel Mcdowell  MD Signature Date/Time: 09/02/2023/11:24:51 AM    Final    MR BRAIN WO CONTRAST Result Date: 09/01/2023 CLINICAL DATA:  Neuro deficit, concern for stroke. Slurred speech, right-sided face and arm numbness. EXAM: MRI  HEAD WITHOUT CONTRAST TECHNIQUE: Multiplanar, multiecho pulse sequences of the brain and surrounding structures were obtained without intravenous contrast. COMPARISON:  Same day CTA head and neck. FINDINGS: Brain: There are a few scattered small foci of acute infarct in the posterior left frontal lobe involving the cortex and subcortical white matter as well as the centrum semiovale. There are small infarcts noted involving the cortex along the lateral aspect of the left pre and postcentral gyri. No evidence of intracranial hemorrhage. Scattered FLAIR signal abnormality in the white matter particularly within the bifrontal subcortical white matter. Small remote infarct in the right cerebellum. No mass lesion or midline shift. Normal appearance of midline structures. The basilar cisterns are patent. No extra-axial fluid collections. Ventricles: Normal size and configuration of the ventricles. Vascular: Skull base flow voids are visualized. Skull and upper cervical spine: No focal abnormality. Sinuses/Orbits: Orbits are symmetric. Mild mucosal thickening in the right anterior ethmoid air cells. Other: Mastoid air cells are clear. 1.1 cm slightly T1 hyperintense focus within the nasopharynx near midline with hypointense appearance on T2 and FLAIR images. IMPRESSION: Small scattered foci of acute infarct in the left frontoparietal lobes involving the cortex and subcortical white matter as well as the centrum semiovale. Involvement of the lateral aspect of the left precentral and postcentral gyri. No significant mass effect or midline shift. Nonspecific white matter signal abnormality likely reflecting mild chronic microvascular ischemic changes. Small remote infarct in the right cerebellum. 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 Consider nonemergent correlation with direct visualization. Electronically Signed   By: Denny Flack M.D.    On: 09/01/2023 14:38   CT ANGIO HEAD NECK W WO CM Result Date: 09/01/2023 CLINICAL DATA:  Slurred speech and right-sided face/arm loss of feeling/numbness. EXAM: CT ANGIOGRAPHY HEAD AND NECK WITH AND WITHOUT CONTRAST TECHNIQUE: Multidetector CT imaging of the head and neck was performed using the standard protocol during bolus administration of intravenous contrast. Multiplanar CT image reconstructions and MIPs were obtained to evaluate the vascular anatomy. Carotid stenosis measurements (when applicable) are obtained utilizing NASCET criteria, using the distal internal carotid diameter as the denominator. RADIATION DOSE REDUCTION: This exam was performed according to the departmental dose-optimization program which includes automated exposure control, adjustment of the mA and/or kV according to patient size and/or use of iterative reconstruction technique. CONTRAST:  75mL OMNIPAQUE IOHEXOL 350 MG/ML SOLN COMPARISON:  None Available. FINDINGS: CT HEAD FINDINGS Brain: No evidence of acute infarction, hemorrhage, hydrocephalus, extra-axial collection or mass lesion/mass effect. No visible white matter disease. Vascular: No hyperdense vessel or unexpected calcification. Skull: Normal. Negative for fracture or focal lesion. Sinuses/Orbits: No acute finding. Review of the MIP images confirms the above findings CTA NECK FINDINGS Aortic arch: Normal with 4 vessel branching Right carotid system: Vessels are smoothly contoured and diffusely patent. Left carotid system: Vessels are smoothly contoured and diffusely patent. No significant atheromatous change Vertebral arteries: The vertebral and proximal subclavian arteries are smoothly contoured and widely patent. Mild dominance of the right vertebral artery. Skeleton: No acute or aggressive finding Other neck: No acute finding Upper chest: Clear apical lungs Review of the MIP images confirms the above findings CTA HEAD FINDINGS Anterior circulation: Atheromatous  calcification of the cavernous carotids. No  branch occlusion, beading, or proximal flow reducing stenosis. No evidence of aneurysm. Arterial detail is diminished by bolus timing which is preferentially venous. Posterior circulation: The vertebral and basilar arteries are smoothly contoured and diffusely patent. No branch occlusion, beading, or aneurysm. Venous sinuses: Diffusely patent Anatomic variants: None significant Review of the MIP images confirms the above findings IMPRESSION: No emergent finding or explanation for symptoms. Mild atherosclerosis without irregularity or significant stenosis of major arteries in the head and neck. Electronically Signed   By: Ronnette Coke M.D.   On: 09/01/2023 10:38     Subjective: Pt feeling much better, nearly completely back to baseline now.   Discharge Exam: Vitals:   09/02/23 0808 09/02/23 1205  BP: (!) 134/100 (!) 129/97  Pulse: 79 85  Resp: 17 18  Temp: 98 F (36.7 C) 98.3 F (36.8 C)  SpO2: 98% 100%   Vitals:   09/02/23 0019 09/02/23 0410 09/02/23 0808 09/02/23 1205  BP: (!) 140/101 (!) 155/105 (!) 134/100 (!) 129/97  Pulse: 98 91 79 85  Resp: 18 18 17 18   Temp: 98.7 F (37.1 C) 99.4 F (37.4 C) 98 F (36.7 C) 98.3 F (36.8 C)  TempSrc: Oral Oral Oral Oral  SpO2: 95% 96% 98% 100%  Weight:      Height:        General: Pt is alert, awake, not in acute distress Cardiovascular: RRR, S1/S2 +, no rubs, no gallops Respiratory: CTA bilaterally, no wheezing, no rhonchi Abdominal: Soft, NT, ND, bowel sounds + Extremities: no edema, no cyanosis Neurological: nonfocal exam   The results of significant diagnostics from this hospitalization (including imaging, microbiology, ancillary and laboratory) are listed below for reference.     Microbiology: No results found for this or any previous visit (from the past 240 hours).   Labs: BNP (last 3 results) No results for input(s): "BNP" in the last 8760 hours. Basic Metabolic  Panel: Recent Labs  Lab 09/01/23 0848  NA 138  K 3.8  CL 105  CO2 24  GLUCOSE 96  BUN 12  CREATININE 0.90  CALCIUM 9.6   Liver Function Tests: Recent Labs  Lab 09/01/23 0848  AST 27  ALT 35  ALKPHOS 83  BILITOT 0.6  PROT 7.9  ALBUMIN 4.2   No results for input(s): "LIPASE", "AMYLASE" in the last 168 hours. No results for input(s): "AMMONIA" in the last 168 hours. CBC: Recent Labs  Lab 09/01/23 0848  WBC 8.7  NEUTROABS 6.8  HGB 15.7  HCT 47.7  MCV 97.5  PLT 284   Cardiac Enzymes: No results for input(s): "CKTOTAL", "CKMB", "CKMBINDEX", "TROPONINI" in the last 168 hours. BNP: Invalid input(s): "POCBNP" CBG: Recent Labs  Lab 09/01/23 0845  GLUCAP 112*   D-Dimer No results for input(s): "DDIMER" in the last 72 hours. Hgb A1c Recent Labs    09/01/23 0848  HGBA1C 5.3   Lipid Profile Recent Labs    09/02/23 0500  CHOL 169  HDL 70  LDLCALC 65  TRIG 171*  CHOLHDL 2.4   Thyroid function studies No results for input(s): "TSH", "T4TOTAL", "T3FREE", "THYROIDAB" in the last 72 hours.  Invalid input(s): "FREET3" Anemia work up No results for input(s): "VITAMINB12", "FOLATE", "FERRITIN", "TIBC", "IRON", "RETICCTPCT" in the last 72 hours. Urinalysis    Component Value Date/Time   COLORURINE YELLOW 09/01/2023 1512   APPEARANCEUR CLEAR 09/01/2023 1512   LABSPEC 1.030 09/01/2023 1512   PHURINE 7.0 09/01/2023 1512   GLUCOSEU NEGATIVE 09/01/2023 1512   HGBUR NEGATIVE  09/01/2023 1512   BILIRUBINUR NEGATIVE 09/01/2023 1512   KETONESUR NEGATIVE 09/01/2023 1512   PROTEINUR NEGATIVE 09/01/2023 1512   UROBILINOGEN 0.2 11/05/2013 0945   NITRITE NEGATIVE 09/01/2023 1512   LEUKOCYTESUR NEGATIVE 09/01/2023 1512   Sepsis Labs Recent Labs  Lab 09/01/23 0848  WBC 8.7   Microbiology No results found for this or any previous visit (from the past 240 hours).  Time coordinating discharge:  25 mins  SIGNED:  Faustino Hook, MD  [facility] 09/02/2023, 12:05 PM How to contact the [facility] Attending or Consulting provider 7A - 7P or covering provider during after hours 7P -7A, for this patient?  Check the care team in [facility] and look for a) attending/consulting [facility] provider listed and b) the [facility] team listed Log into [email or web address] and use [facility] universal password to access. If you do not have the password, please contact the hospital operator. Locate the [facility] provider you are looking for under [facility] and page to a number that you can be directly reached. If you still have difficulty reaching the provider, please page the [facility] (Director on Call) for the Hospitalists listed on amion for assistance.

## 2023-09-02 NOTE — Plan of Care (Signed)
  Problem: Health Behavior/Discharge Planning: Goal: Ability to manage health-related needs will improve Outcome: Progressing   Problem: Coping: Goal: Level of anxiety will decrease Outcome: Progressing   

## 2023-09-02 NOTE — Plan of Care (Signed)

## 2023-09-02 NOTE — Telephone Encounter (Signed)
-----   Message from [facility] sent at 09/02/2023 11:41 AM EDT ----- Regarding: 30 day Cardiac Monitor September 02, 2023  Dear Jake Seats,  I have an inpatient that was admitted for stroke. The neurologist requested he be set up with a 30 day cardiac monitor.  He will be discharging home today but I told him you can have the monitor mailed to his home.  Thank you for all of your help!   Maryln Manuel MD  [facility]

## 2023-09-02 NOTE — Evaluation (Signed)
 Physical Therapy Evaluation Patient Details Name: Cory Terry MRN: [id number] DOB: 1965-12-20 Today's Date: 09/02/2023  History of Present Illness  Cory Terry is a 58 year old male with history of chronic back pain and chronic left hip pain (says he needs a left hip replacement), GERD, BPH, marijuana user, reports he started having numbness on the right side of his face and arm that started yesterday evening around 10:30 PM.  He was also having difficulty with his slurred speech and difficulty using his right hand. He did not have symptoms in the right lower extremity.  He said that he has been under a lof of stress with his job and he had eaten a burger and taco yesterday with a lot of sodium content.  He decided to come to the emergency department because his symptoms had not resolved but did report that they were little bit more improved.  He was evaluated in the ED today with findings of small scattered foci of acute infarct in the left frontoparietal lobes involving the cortex and subcortical white matter as well as the centrum semiovale.  He had a CTA of the head and neck performed with no emergent findings of severe stenosis.  He is being admitted for acute stroke workup and management.   Clinical Impression  Patient tolerated PT/OT evaluation well. Patient reports PLOF as (I) with all ADLs, iADLs, and mobility. On this date, patient independent when assessing bed mobility, functional transfers, and mobility. Patient demonstrates mild limping when ambulating due to chronic L hip pain, and mild strength deficits but reports this as baseline. Patient does not present with urgent need for skilled physical therapy at this time. Patient discharged to care of nursing for ambulation daily as tolerated for length of stay.       If plan is discharge home, recommend the following:     Can travel by private vehicle        Equipment Recommendations None recommended by PT  Recommendations for  Other Services       Functional Status Assessment Patient has had a recent decline in their functional status and demonstrates the ability to make significant improvements in function in a reasonable and predictable amount of time.     Precautions / Restrictions Precautions Precautions: Fall Recall of Precautions/Restrictions: Intact Restrictions Weight Bearing Restrictions Per Provider Order: No      Mobility  Bed Mobility Overal bed mobility: Independent        Transfers Overall transfer level: Independent Equipment used: None    Ambulation/Gait Ambulation/Gait assistance: Independent Gait Distance (Feet): 150 Feet Assistive device: None Gait Pattern/deviations: Decreased stance time - left, Decreased weight shift to left, Step-through pattern Gait velocity: WNL     General Gait Details: Pt with above deficits 2/2 chronic hip pain. Reports this as baseline gait deviation. No overt LOB or unsteadienss to note  Engineer, materials     Tilt Bed    Modified Rankin (Stroke Patients Only)       Balance Overall balance assessment: Mild deficits observed, not formally tested       Pertinent Vitals/Pain Pain Assessment Pain Assessment: No/denies pain    Home Living Family/patient expects to be discharged to:: Private residence Living Arrangements: Alone Available Help at Discharge: Family Type of Home: Mobile home Home Access: Stairs to enter Entrance Stairs-Rails: Can reach both;Left;Right Entrance Stairs-Number of Steps: 2   Home Layout: One level Home Equipment: Cane - single point      Prior  Function Prior Level of Function : Independent/Modified Independent   Mobility Comments: Community ambulator with SPC use PRN ADLs Comments: Works; independent     Extremity/Trunk Assessment   Upper Extremity Assessment Upper Extremity Assessment: Defer to OT evaluation RUE Deficits / Details: 4/5 abduction; 4-/5 external rotation, 5/5 MMT  otherwise. RUE Sensation: WNL RUE Coordination: WNL    Lower Extremity Assessment Lower Extremity Assessment: Overall WFL for tasks assessed;LLE deficits/detail;RLE deficits/detail RLE Deficits / Details: Grossly 4+/5 MMT hip flex, knee ext. 5/5 DF, Alternating foot taps and heel to shin WNL RLE Sensation: WNL LLE Deficits / Details: Grossly 4-/5 MMT hip flex, knee ext. 5/5 DF. Pt reporting need of THA on L.Alternating foot taps and heel to shin WNL LLE Coordination: WNL    Cervical / Trunk Assessment Cervical / Trunk Assessment: Normal  Communication   Communication Communication: No apparent difficulties    Cognition Arousal: Alert Behavior During Therapy: WFL for tasks assessed/performed         Following commands: Intact       Cueing Cueing Techniques: Verbal cues     General Comments      Exercises     Assessment/Plan    PT Assessment Patient does not need any further PT services  PT Problem List         PT Treatment Interventions      PT Goals (Current goals can be found in the Care Plan section)  Acute Rehab PT Goals Patient Stated Goal: Return home PT Goal Formulation: With patient Time For Goal Achievement: 09/03/23 Potential to Achieve Goals: Good    Frequency       Co-evaluation PT/OT/SLP Co-Evaluation/Treatment: Yes Reason for Co-Treatment: To address functional/ADL transfers PT goals addressed during session: Mobility/safety with mobility OT goals addressed during session: ADL's and self-care       AM-PAC PT "6 Clicks" Mobility  Outcome Measure Help needed turning from your back to your side while in a flat bed without using bedrails?: None Help needed moving from lying on your back to sitting on the side of a flat bed without using bedrails?: None Help needed moving to and from a bed to a chair (including a wheelchair)?: None Help needed standing up from a chair using your arms (e.g., wheelchair or bedside chair)?: None Help needed  to walk in hospital room?: None Help needed climbing 3-5 steps with a railing? : None 6 Click Score: 24    End of Session   Activity Tolerance: Patient tolerated treatment well Patient left: in bed;with call bell/phone within reach   PT Visit Diagnosis: Pain;Muscle weakness (generalized) (M62.81);Other abnormalities of gait and mobility (R26.89) Pain - Right/Left: Left Pain - part of body: Hip    Time: 1027-2536 PT Time Calculation (min) (ACUTE ONLY): 8 min   Charges:   PT Evaluation $PT Eval Low Complexity: 1 Low PT Treatments $Therapeutic Activity: 8-22 mins PT General Charges $$ ACUTE PT VISIT: 1 Visit         10:44 AM, 09/02/23 Chryl Heck, PT, DPT [place] [facility]

## 2023-09-02 NOTE — Evaluation (Signed)
 Occupational Therapy Evaluation Patient Details Name: Cory Terry MRN: [id number] DOB: Nov 03, 1965 Today's Date: 09/02/2023   History of Present Illness   Cory Terry is a 58 year old male with history of chronic back pain and chronic left hip pain (says he needs a left hip replacement), GERD, BPH, marijuana user, reports he started having numbness on the right side of his face and arm that started yesterday evening around 10:30 PM.  He was also having difficulty with his slurred speech and difficulty using his right hand. He did not have symptoms in the right lower extremity.  He said that he has been under a lof of stress with his job and he had eaten a burger and taco yesterday with a lot of sodium content.  He decided to come to the emergency department because his symptoms had not resolved but did report that they were little bit more improved.  He was evaluated in the ED today with findings of small scattered foci of acute infarct in the left frontoparietal lobes involving the cortex and subcortical white matter as well as the centrum semiovale.  He had a CTA of the head and neck performed with no emergent findings of severe stenosis.  He is being admitted for acute stroke workup and management. (per MD)     Clinical Impressions Pt agreeable to OT and PT co-evaluation. Pt reports feeling back to normal. L hip pain at baseline with pt seeking a hip replacement at home point. Mild R UE shoulder abduction weakness. No other deficits noted. Pt ambulated independently. Pt is not recommended for further acute OT services and will be discharged to care of nursing staff for remaining length of stay.               Functional Status Assessment   Patient has not had a recent decline in their functional status     Equipment Recommendations   None recommended by OT             Precautions/Restrictions   Precautions Precautions: Fall Recall of Precautions/Restrictions:  Intact Restrictions Weight Bearing Restrictions Per Provider Order: No     Mobility Bed Mobility Overal bed mobility: Independent                  Transfers Overall transfer level: Independent                        Balance Overall balance assessment: Mild deficits observed, not formally tested                                         ADL either performed or assessed with clinical judgement   ADL Overall ADL's : Independent                                             Vision Baseline Vision/History: 1 Wears glasses Ability to See in Adequate Light: 1 Impaired Patient Visual Report: No change from baseline Vision Assessment?: No apparent visual deficits     Perception Perception: Not tested       Praxis Praxis: Not tested       Pertinent Vitals/Pain Pain Assessment Pain Assessment: No/denies pain     Extremity/Trunk Assessment Upper Extremity Assessment Upper Extremity Assessment: RUE  deficits/detail RUE Deficits / Details: 4/5 abduction; 4-/5 external rotation, 5/5 MMT otherwise. RUE Sensation: WNL RUE Coordination: WNL   Lower Extremity Assessment Lower Extremity Assessment: Defer to PT evaluation   Cervical / Trunk Assessment Cervical / Trunk Assessment: Normal   Communication Communication Communication: No apparent difficulties   Cognition Arousal: Alert Behavior During Therapy: WFL for tasks assessed/performed Cognition: No apparent impairments                               Following commands: Intact       Cueing  General Comments   Cueing Techniques: Verbal cues                 Home Living Family/patient expects to be discharged to:: Private residence Living Arrangements: Alone Available Help at Discharge: Family Type of Home: Mobile home Home Access: Stairs to enter Secretary/administrator of Steps: 2 Entrance Stairs-Rails: Can reach both;Left;Right Home Layout:  One level     Bathroom Shower/Tub: Chief Strategy Officer: Standard Bathroom Accessibility: Yes How Accessible: Accessible via walker Home Equipment: Cane - single point          Prior Functioning/Environment Prior Level of Function : Independent/Modified Independent             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 ADLs Comments: Works; independent                            Co-evaluation PT/OT/SLP Co-Evaluation/Treatment: Yes Reason for Co-Treatment: To address functional/ADL transfers   OT goals addressed during session: ADL's and self-care      AM-PAC OT "6 Clicks" Daily Activity     Outcome Measure Help from another person eating meals?: None Help from another person taking care of personal grooming?: None Help from another person toileting, which includes using toliet, bedpan, or urinal?: None Help from another person bathing (including washing, rinsing, drying)?: None Help from another person to put on and taking off regular upper body clothing?: None Help from another person to put on and taking off regular lower body clothing?: None 6 Click Score: 24   End of Session    Activity Tolerance: Patient tolerated treatment well Patient left: in bed;with call bell/phone within reach  OT Visit Diagnosis: Muscle weakness (generalized) (M62.81);Other symptoms and signs involving the nervous system (W09.811)                Time: 9147-8295 OT Time Calculation (min): 18 min Charges:  OT General Charges $OT Visit: 1 Visit OT Evaluation $OT Eval Low Complexity: 1 Low  Zerenity Bowron OT, MOT  Danie Chandler 09/02/2023, 10:15 AM

## 2023-09-02 NOTE — Progress Notes (Signed)
 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    Maryln Manuel MD

## 2023-09-09 ENCOUNTER — Telehealth: Payer: Self-pay | Admitting: *Deleted

## 2023-09-09 NOTE — Telephone Encounter (Signed)
 Received a fax stating that the monitor service would be cancelling the study for the pt d/t Unable to deliver required patient benefit quote.

## 2023-11-28 ENCOUNTER — Ambulatory Visit: Payer: Self-pay | Admitting: Neurology

## 2023-11-28 ENCOUNTER — Encounter: Payer: Self-pay | Admitting: Neurology

## 2024-03-07 DIAGNOSIS — Z79899 Other long term (current) drug therapy: Secondary | ICD-10-CM | POA: Diagnosis not present

## 2024-03-07 DIAGNOSIS — R0602 Shortness of breath: Secondary | ICD-10-CM | POA: Diagnosis not present

## 2024-03-07 DIAGNOSIS — M25552 Pain in left hip: Secondary | ICD-10-CM | POA: Diagnosis not present

## 2024-03-07 DIAGNOSIS — E559 Vitamin D deficiency, unspecified: Secondary | ICD-10-CM | POA: Diagnosis not present

## 2024-03-07 DIAGNOSIS — M546 Pain in thoracic spine: Secondary | ICD-10-CM | POA: Diagnosis not present

## 2024-03-07 DIAGNOSIS — Z125 Encounter for screening for malignant neoplasm of prostate: Secondary | ICD-10-CM | POA: Diagnosis not present

## 2024-03-07 DIAGNOSIS — M5441 Lumbago with sciatica, right side: Secondary | ICD-10-CM | POA: Diagnosis not present

## 2024-03-07 DIAGNOSIS — Z0000 Encounter for general adult medical examination without abnormal findings: Secondary | ICD-10-CM | POA: Diagnosis not present

## 2024-03-07 DIAGNOSIS — M5442 Lumbago with sciatica, left side: Secondary | ICD-10-CM | POA: Diagnosis not present

## 2024-03-07 DIAGNOSIS — Z1159 Encounter for screening for other viral diseases: Secondary | ICD-10-CM | POA: Diagnosis not present

## 2024-03-07 DIAGNOSIS — I10 Essential (primary) hypertension: Secondary | ICD-10-CM | POA: Diagnosis not present

## 2024-03-07 DIAGNOSIS — M129 Arthropathy, unspecified: Secondary | ICD-10-CM | POA: Diagnosis not present

## 2024-03-09 DIAGNOSIS — M546 Pain in thoracic spine: Secondary | ICD-10-CM | POA: Diagnosis not present

## 2024-03-09 DIAGNOSIS — M5441 Lumbago with sciatica, right side: Secondary | ICD-10-CM | POA: Diagnosis not present

## 2024-03-09 DIAGNOSIS — M25552 Pain in left hip: Secondary | ICD-10-CM | POA: Diagnosis not present

## 2024-03-09 DIAGNOSIS — M5442 Lumbago with sciatica, left side: Secondary | ICD-10-CM | POA: Diagnosis not present

## 2024-03-12 DIAGNOSIS — I10 Essential (primary) hypertension: Secondary | ICD-10-CM | POA: Diagnosis not present

## 2024-03-12 DIAGNOSIS — K219 Gastro-esophageal reflux disease without esophagitis: Secondary | ICD-10-CM | POA: Diagnosis not present

## 2024-03-12 DIAGNOSIS — M5441 Lumbago with sciatica, right side: Secondary | ICD-10-CM | POA: Diagnosis not present

## 2024-03-12 DIAGNOSIS — M25552 Pain in left hip: Secondary | ICD-10-CM | POA: Diagnosis not present

## 2024-03-12 DIAGNOSIS — R972 Elevated prostate specific antigen [PSA]: Secondary | ICD-10-CM | POA: Diagnosis not present

## 2024-03-12 DIAGNOSIS — Z79899 Other long term (current) drug therapy: Secondary | ICD-10-CM | POA: Diagnosis not present

## 2024-03-12 DIAGNOSIS — M5442 Lumbago with sciatica, left side: Secondary | ICD-10-CM | POA: Diagnosis not present

## 2024-03-12 DIAGNOSIS — E559 Vitamin D deficiency, unspecified: Secondary | ICD-10-CM | POA: Diagnosis not present

## 2024-03-26 DIAGNOSIS — E559 Vitamin D deficiency, unspecified: Secondary | ICD-10-CM | POA: Diagnosis not present

## 2024-03-26 DIAGNOSIS — M25552 Pain in left hip: Secondary | ICD-10-CM | POA: Diagnosis not present

## 2024-03-26 DIAGNOSIS — Z79899 Other long term (current) drug therapy: Secondary | ICD-10-CM | POA: Diagnosis not present

## 2024-03-26 DIAGNOSIS — R972 Elevated prostate specific antigen [PSA]: Secondary | ICD-10-CM | POA: Diagnosis not present

## 2024-03-26 DIAGNOSIS — I10 Essential (primary) hypertension: Secondary | ICD-10-CM | POA: Diagnosis not present

## 2024-03-26 DIAGNOSIS — K219 Gastro-esophageal reflux disease without esophagitis: Secondary | ICD-10-CM | POA: Diagnosis not present

## 2024-03-26 DIAGNOSIS — M5441 Lumbago with sciatica, right side: Secondary | ICD-10-CM | POA: Diagnosis not present

## 2024-03-26 DIAGNOSIS — M5442 Lumbago with sciatica, left side: Secondary | ICD-10-CM | POA: Diagnosis not present

## 2024-03-26 DIAGNOSIS — Z131 Encounter for screening for diabetes mellitus: Secondary | ICD-10-CM | POA: Diagnosis not present

## 2024-04-02 DIAGNOSIS — Z79899 Other long term (current) drug therapy: Secondary | ICD-10-CM | POA: Diagnosis not present

## 2024-04-09 DIAGNOSIS — R972 Elevated prostate specific antigen [PSA]: Secondary | ICD-10-CM | POA: Diagnosis not present

## 2024-04-09 DIAGNOSIS — E559 Vitamin D deficiency, unspecified: Secondary | ICD-10-CM | POA: Diagnosis not present

## 2024-04-09 DIAGNOSIS — Z131 Encounter for screening for diabetes mellitus: Secondary | ICD-10-CM | POA: Diagnosis not present

## 2024-04-09 DIAGNOSIS — M5442 Lumbago with sciatica, left side: Secondary | ICD-10-CM | POA: Diagnosis not present

## 2024-04-09 DIAGNOSIS — K219 Gastro-esophageal reflux disease without esophagitis: Secondary | ICD-10-CM | POA: Diagnosis not present

## 2024-04-09 DIAGNOSIS — M5441 Lumbago with sciatica, right side: Secondary | ICD-10-CM | POA: Diagnosis not present

## 2024-04-09 DIAGNOSIS — M25552 Pain in left hip: Secondary | ICD-10-CM | POA: Diagnosis not present

## 2024-04-09 DIAGNOSIS — Z79899 Other long term (current) drug therapy: Secondary | ICD-10-CM | POA: Diagnosis not present

## 2024-04-09 DIAGNOSIS — I10 Essential (primary) hypertension: Secondary | ICD-10-CM | POA: Diagnosis not present

## 2024-04-09 DIAGNOSIS — Z6822 Body mass index (BMI) 22.0-22.9, adult: Secondary | ICD-10-CM | POA: Diagnosis not present

## 2024-04-11 DIAGNOSIS — Z79899 Other long term (current) drug therapy: Secondary | ICD-10-CM | POA: Diagnosis not present

## 2024-05-03 DIAGNOSIS — R972 Elevated prostate specific antigen [PSA]: Secondary | ICD-10-CM | POA: Diagnosis not present

## 2024-05-03 DIAGNOSIS — K219 Gastro-esophageal reflux disease without esophagitis: Secondary | ICD-10-CM | POA: Diagnosis not present

## 2024-05-03 DIAGNOSIS — M25552 Pain in left hip: Secondary | ICD-10-CM | POA: Diagnosis not present

## 2024-05-03 DIAGNOSIS — Z6821 Body mass index (BMI) 21.0-21.9, adult: Secondary | ICD-10-CM | POA: Diagnosis not present

## 2024-05-03 DIAGNOSIS — M5441 Lumbago with sciatica, right side: Secondary | ICD-10-CM | POA: Diagnosis not present

## 2024-05-03 DIAGNOSIS — Z79899 Other long term (current) drug therapy: Secondary | ICD-10-CM | POA: Diagnosis not present

## 2024-05-03 DIAGNOSIS — E559 Vitamin D deficiency, unspecified: Secondary | ICD-10-CM | POA: Diagnosis not present

## 2024-05-03 DIAGNOSIS — M5442 Lumbago with sciatica, left side: Secondary | ICD-10-CM | POA: Diagnosis not present

## 2024-05-03 DIAGNOSIS — N401 Enlarged prostate with lower urinary tract symptoms: Secondary | ICD-10-CM | POA: Diagnosis not present

## 2024-05-03 DIAGNOSIS — I10 Essential (primary) hypertension: Secondary | ICD-10-CM | POA: Diagnosis not present

## 2024-05-04 DIAGNOSIS — Z419 Encounter for procedure for purposes other than remedying health state, unspecified: Secondary | ICD-10-CM | POA: Diagnosis not present

## 2024-05-22 DIAGNOSIS — M25552 Pain in left hip: Secondary | ICD-10-CM | POA: Diagnosis not present

## 2024-05-22 DIAGNOSIS — E559 Vitamin D deficiency, unspecified: Secondary | ICD-10-CM | POA: Diagnosis not present

## 2024-05-22 DIAGNOSIS — Z79899 Other long term (current) drug therapy: Secondary | ICD-10-CM | POA: Diagnosis not present

## 2024-05-22 DIAGNOSIS — M5441 Lumbago with sciatica, right side: Secondary | ICD-10-CM | POA: Diagnosis not present

## 2024-05-22 DIAGNOSIS — K219 Gastro-esophageal reflux disease without esophagitis: Secondary | ICD-10-CM | POA: Diagnosis not present

## 2024-05-22 DIAGNOSIS — G8929 Other chronic pain: Secondary | ICD-10-CM | POA: Diagnosis not present

## 2024-05-22 DIAGNOSIS — N401 Enlarged prostate with lower urinary tract symptoms: Secondary | ICD-10-CM | POA: Diagnosis not present

## 2024-05-22 DIAGNOSIS — I10 Essential (primary) hypertension: Secondary | ICD-10-CM | POA: Diagnosis not present

## 2024-05-22 DIAGNOSIS — R972 Elevated prostate specific antigen [PSA]: Secondary | ICD-10-CM | POA: Diagnosis not present

## 2024-05-22 DIAGNOSIS — Z6821 Body mass index (BMI) 21.0-21.9, adult: Secondary | ICD-10-CM | POA: Diagnosis not present
